# Patient Record
Sex: Female | Born: 1990 | Hispanic: No | Smoking: Never smoker
Health system: Southern US, Community
[De-identification: ages and names within clinical notes are randomized; demographics above are authoritative.]

## PROBLEM LIST (undated history)

## (undated) DIAGNOSIS — G9050 Complex regional pain syndrome I, unspecified: Secondary | ICD-10-CM

## (undated) DIAGNOSIS — S0300XA Dislocation of jaw, unspecified side, initial encounter: Secondary | ICD-10-CM

## (undated) DIAGNOSIS — F319 Bipolar disorder, unspecified: Secondary | ICD-10-CM

## (undated) DIAGNOSIS — O24419 Gestational diabetes mellitus in pregnancy, unspecified control: Secondary | ICD-10-CM

## (undated) DIAGNOSIS — H409 Unspecified glaucoma: Secondary | ICD-10-CM

## (undated) DIAGNOSIS — Z3490 Encounter for supervision of normal pregnancy, unspecified, unspecified trimester: Secondary | ICD-10-CM

## (undated) DIAGNOSIS — N83209 Unspecified ovarian cyst, unspecified side: Secondary | ICD-10-CM

## (undated) HISTORY — DX: Unspecified glaucoma: H40.9

## (undated) HISTORY — DX: Gestational diabetes mellitus in pregnancy, unspecified control: O24.419

## (undated) HISTORY — DX: Bipolar disorder, unspecified: F31.9

## (undated) HISTORY — DX: Dislocation of jaw, unspecified side, initial encounter: S03.00XA

## (undated) HISTORY — DX: Complex regional pain syndrome I, unspecified: G90.50

---
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
## 2014-02-17 DIAGNOSIS — O35DXX0 Maternal care for other (suspected) fetal abnormality and damage, fetal gastrointestinal anomalies, not applicable or unspecified: Secondary | ICD-10-CM

## 2014-07-23 DIAGNOSIS — G43109 Migraine with aura, not intractable, without status migrainosus: Secondary | ICD-10-CM | POA: Insufficient documentation

## 2016-05-04 ENCOUNTER — Encounter (HOSPITAL_COMMUNITY): Payer: Self-pay | Admitting: Emergency Medicine

## 2016-05-04 ENCOUNTER — Emergency Department (HOSPITAL_COMMUNITY)
Admission: EM | Admit: 2016-05-04 | Discharge: 2016-05-05 | Disposition: A | Discharge status: Home / Self Care | Payer: Self-pay | Attending: Emergency Medicine | Admitting: Emergency Medicine

## 2016-05-04 ENCOUNTER — Emergency Department (HOSPITAL_COMMUNITY): Payer: Self-pay

## 2016-05-04 DIAGNOSIS — R102 Pelvic and perineal pain: Secondary | ICD-10-CM | POA: Insufficient documentation

## 2016-05-04 DIAGNOSIS — Z3492 Encounter for supervision of normal pregnancy, unspecified, second trimester: Secondary | ICD-10-CM

## 2016-05-04 DIAGNOSIS — O2342 Unspecified infection of urinary tract in pregnancy, second trimester: Secondary | ICD-10-CM | POA: Insufficient documentation

## 2016-05-04 DIAGNOSIS — Z3A18 18 weeks gestation of pregnancy: Secondary | ICD-10-CM | POA: Insufficient documentation

## 2016-05-04 DIAGNOSIS — N949 Unspecified condition associated with female genital organs and menstrual cycle: Secondary | ICD-10-CM

## 2016-05-04 DIAGNOSIS — N390 Urinary tract infection, site not specified: Secondary | ICD-10-CM

## 2016-05-04 DIAGNOSIS — Z79899 Other long term (current) drug therapy: Secondary | ICD-10-CM | POA: Insufficient documentation

## 2016-05-04 HISTORY — DX: Unspecified ovarian cyst, unspecified side: N83.209

## 2016-05-04 HISTORY — DX: Encounter for supervision of normal pregnancy, unspecified, unspecified trimester: Z34.90

## 2016-05-04 LAB — URINALYSIS, ROUTINE W REFLEX MICROSCOPIC
Bilirubin Urine: NEGATIVE
Glucose, UA: NEGATIVE mg/dL
HGB URINE DIPSTICK: NEGATIVE
KETONES UR: NEGATIVE mg/dL
NITRITE: NEGATIVE
PROTEIN: NEGATIVE mg/dL
Specific Gravity, Urine: 1.015 (ref 1.005–1.030)
pH: 8 (ref 5.0–8.0)

## 2016-05-04 LAB — WET PREP, GENITAL
Clue Cells Wet Prep HPF POC: NONE SEEN
Sperm: NONE SEEN
Trich, Wet Prep: NONE SEEN
Yeast Wet Prep HPF POC: NONE SEEN

## 2016-05-04 LAB — COMPREHENSIVE METABOLIC PANEL
ALT: 10 U/L — ABNORMAL LOW (ref 14–54)
ANION GAP: 6 (ref 5–15)
AST: 14 U/L — ABNORMAL LOW (ref 15–41)
Albumin: 2.3 g/dL — ABNORMAL LOW (ref 3.5–5.0)
Alkaline Phosphatase: 46 U/L (ref 38–126)
BILIRUBIN TOTAL: 0.3 mg/dL (ref 0.3–1.2)
BUN: <5 mg/dL — ABNORMAL LOW (ref 6–20)
CO2: 22 mmol/L (ref 22–32)
Calcium: 8.4 mg/dL — ABNORMAL LOW (ref 8.9–10.3)
Chloride: 107 mmol/L (ref 101–111)
Creatinine, Ser: 0.47 mg/dL (ref 0.44–1.00)
GFR calc Af Amer: >60 mL/min (ref >60)
Glucose, Bld: 100 mg/dL — ABNORMAL HIGH (ref 65–99)
POTASSIUM: 3.6 mmol/L (ref 3.5–5.1)
Sodium: 135 mmol/L (ref 135–145)
TOTAL PROTEIN: 5.6 g/dL — AB (ref 6.5–8.1)

## 2016-05-04 LAB — CBC
HEMATOCRIT: 33.5 % — AB (ref 36.0–46.0)
HEMOGLOBIN: 11 g/dL — AB (ref 12.0–15.0)
MCH: 25.3 pg — ABNORMAL LOW (ref 26.0–34.0)
MCHC: 32.8 g/dL (ref 30.0–36.0)
MCV: 77 fL — ABNORMAL LOW (ref 78.0–100.0)
Platelets: 380 10*3/uL (ref 150–400)
RBC: 4.35 MIL/uL (ref 3.87–5.11)
RDW: 14.8 % (ref 11.5–15.5)
WBC: 10.5 10*3/uL (ref 4.0–10.5)

## 2016-05-04 LAB — LIPASE, BLOOD: Lipase: 13 U/L (ref 11–51)

## 2016-05-04 MED ORDER — ACETAMINOPHEN 325 MG PO TABS
650.0000 mg | ORAL_TABLET | Freq: Once | ORAL | Status: AC
  Administered 2016-05-04: 650 mg via ORAL
  Filled 2016-05-04: qty 2

## 2016-05-04 NOTE — ED Triage Notes (Signed)
C/o pelvic pain and vaginal pressure x 1 hour.  Pt states she is [redacted] weeks pregnant and had normal OB appt yesterday.  Also reports 2 cyst on R ovary.

## 2016-05-04 NOTE — ED Provider Notes (Signed)
[facility] DEPT Provider Note    By signing my name below, I, Andrea [name], attest that this documentation has been prepared under the direction and in the presence of [name], [place]. Electronically Signed: Earmon PhoenixJennifer [name], ED Scribe. 05/04/16. 10:28 PM.    History   Chief Complaint Chief Complaint  Patient presents with  . Pelvic Pain    [redacted] weeks pregnant  . vaginal pressure    The history is provided by the patient and medical records. No language interpreter was used.  Pelvic Pain  This is a new problem. The current episode started 1 to 2 hours ago. The problem occurs constantly. The problem has not changed since onset.Associated symptoms include abdominal pain. Nothing aggravates the symptoms. Nothing relieves the symptoms. She has tried nothing for the symptoms.    HPI Comments:  Andrea [name] is a 25 y.o. female at 4118 weeks gestation, G3P2, who presents to the Emergency Department complaining of lower abdominal cramping pain that began about two hours ago. She rates the pain at 8/10. She states she went to sit down when the pain began. She reports associated pelvic pressure. She has not taken anything for her pain. She denies modifying factors. She denies any vaginal discharge or bleeding, dyspareunia, dysuria, fever, chills, nausea, vomiting. She reports she last had sex last night. [place] lives[place] and [place] in [place]. She reports both her previous children were delivered at 31 and 32 weeks. She denies placenta previa.   Past Medical History:  Diagnosis Date  . Ovarian cyst   . Pregnant     There are no active problems to display for this patient.   History reviewed. No pertinent surgical history.  OB History    Gravida Para Term Preterm AB Living   1             SAB TAB Ectopic Multiple Live Births                   Home Medications    Prior to Admission medications   Medication Sig  Start Date End Date Taking? Authorizing Provider  cyclobenzaprine (FLEXERIL) 5 MG tablet Take 1 tablet (5 mg total) by mouth 2 (two) times daily as needed for muscle spasms. 05/05/16   Hope Orlene OchM Neese, NP  nitrofurantoin, macrocrystal-monohydrate, (MACROBID) 100 MG capsule Take 1 capsule (100 mg total) by mouth 2 (two) times daily. 05/05/16   Hope Orlene OchM Neese, NP    Family History No family history on file.  Social History Social History  Substance Use Topics  . Smoking status: Never Smoker  . Smokeless tobacco: Never Used  . Alcohol use No     Allergies   Ultrasound gel   Review of Systems Review of Systems  Constitutional: Negative for chills and fever.  Gastrointestinal: Positive for abdominal pain. Negative for nausea and vomiting.  Genitourinary: Positive for pelvic pain. Negative for difficulty urinating, dyspareunia, dysuria, frequency, urgency, vaginal bleeding and vaginal discharge.  All other systems reviewed and are negative.    Physical Exam Updated Vital Signs BP 119/76 (BP Location: Left Arm)   Pulse 88   Temp 97.7 F (36.5 C) (Oral)   Resp 16   SpO2 95%   Physical Exam  Constitutional: She is oriented to person, place, and time. She appears well-developed and well-nourished.  Eyes: EOM are normal.  Neck: Neck supple.  Cardiovascular: Normal rate, regular rhythm and normal heart sounds.  Exam  reveals no gallop and no friction rub.   No murmur heard. Pulmonary/Chest: Effort normal and breath sounds normal. No respiratory distress. She has no wheezes. She has no rales.  Abdominal: Soft. Bowel sounds are normal. There is tenderness. There is no rebound and no guarding.  Generalized tenderness with increased tenderness in lower abdomen. Fetal heart tones 160s.  Genitourinary: Pelvic exam was performed with patient supine. There is no lesion on the right labia. There is no lesion on the left labia.  Genitourinary Comments: External genitalia without lesions. White  discharge in vaginal vault. Cervix posterior. Internal os closed. Uterus gravid consistent with dates.  Musculoskeletal: Normal range of motion.  Neurological: She is alert and oriented to person, place, and time. No cranial nerve deficit.  Skin: Skin is warm and dry.  Nursing note and vitals reviewed.    ED Treatments / Results  DIAGNOSTIC STUDIES: Oxygen Saturation is 95% on RA, adequate by my interpretation.   COORDINATION OF CARE: 10:17 PM- Will check fetal heart tones and perform pelvic exam. Pt verbalizes understanding and agrees to plan.  10:28 PM- Will order ultrasound.  Medications  acetaminophen (TYLENOL) tablet 650 mg (650 mg Oral Given 05/04/16 2315)  cyclobenzaprine (FLEXERIL) tablet 5 mg (5 mg Oral Given 05/05/16 0048)  nitrofurantoin (macrocrystal-monohydrate) (MACROBID) capsule 100 mg (100 mg Oral Given 05/05/16 0052)    Labs (all labs ordered are listed, but only abnormal results are displayed) Labs Reviewed  WET PREP, GENITAL - Abnormal; Notable for the following:       Result Value   WBC, Wet Prep HPF POC MANY (*)    All other components within normal limits  COMPREHENSIVE METABOLIC PANEL - Abnormal; Notable for the following:    Glucose, Bld 100 (*)    BUN <5 (*)    Calcium 8.4 (*)    Total Protein 5.6 (*)    Albumin 2.3 (*)    AST 14 (*)    ALT 10 (*)    All other components within normal limits  CBC - Abnormal; Notable for the following:    Hemoglobin 11.0 (*)    HCT 33.5 (*)    MCV 77.0 (*)    MCH 25.3 (*)    All other components within normal limits  URINALYSIS, ROUTINE W REFLEX MICROSCOPIC - Abnormal; Notable for the following:    APPearance CLOUDY (*)    Leukocytes, UA TRACE (*)    Bacteria, UA RARE (*)    Squamous Epithelial / LPF 0-5 (*)    All other components within normal limits  URINE CULTURE  LIPASE, BLOOD  GC/CHLAMYDIA PROBE AMP ([place]) NOT AT [facility]     Radiology [place] Ob Limited  Result Date: 05/05/2016 CLINICAL DATA:   25 year old pregnant female with abdominal pain EXAM: LIMITED OBSTETRIC ULTRASOUND FINDINGS: Number of Fetuses: 1 Heart Rate:  157 bpm Movement: Detected Presentation: Cephalic Placental Location: Anterior Previa: None Amniotic Fluid (Subjective):  Within normal limits. BPD:  4.3cm 18w  6d MATERNAL FINDINGS: Cervix:  Appears closed. Uterus/Adnexae:  No abnormality visualized. IMPRESSION: Single live intrauterine pregnancy with an estimated gestational age of [redacted] weeks, 6 days based on today's measurements. No acute findings identified. This exam is performed on an emergent basis and does not comprehensively evaluate fetal size, dating, or anatomy; follow-up complete OB US should be considered if further fetal assessment is warranted. Electronically Signed   By: Elgie CollardArash  Radparvar M.D.   On: 05/05/2016 00:32    Procedures Procedures (including critical care time) Consult  with Dr. Emelda Fear, on call OB, will treat for UTI with Macrobid and muscle relaxant for possible round ligament pain. Urine sent for culture. Patient to f/u with her OB or go to [facility] for worsening symptoms. Pelvic rest. Discussed with the patient and all questioned fully answered. Patient appears stable for d/c with normal OB ultrasound.   Medications Ordered in ED Medications  acetaminophen (TYLENOL) tablet 650 mg (650 mg Oral Given 05/04/16 2315)  cyclobenzaprine (FLEXERIL) tablet 5 mg (5 mg Oral Given 05/05/16 0048)  nitrofurantoin (macrocrystal-monohydrate) (MACROBID) capsule 100 mg (100 mg Oral Given 05/05/16 0052)     Initial Impression / Assessment and Plan / ED Course  I have reviewed the triage vital signs and the nursing notes.  Pertinent labs & imaging results that were available during my care of the patient were reviewed by me and considered in my medical decision making (see chart for details).  Clinical Course     I personally performed the services described in this documentation, which was scribed in my  presence. The recorded information has been reviewed and is accurate.  Final Clinical Impressions(s) / ED Diagnoses   Final diagnoses:  Lower urinary tract infectious disease  Round ligament pain  Second trimester pregnancy    New Prescriptions New Prescriptions   CYCLOBENZAPRINE (FLEXERIL) 5 MG TABLET    Take 1 tablet (5 mg total) by mouth 2 (two) times daily as needed for muscle spasms.   NITROFURANTOIN, MACROCRYSTAL-MONOHYDRATE, (MACROBID) 100 MG CAPSULE    Take 1 capsule (100 mg total) by mouth 2 (two) times daily.     [address] [place], NP 05/05/16 0105    Gilda Crease, MD 05/05/16 [phone]

## 2016-05-05 MED ORDER — CYCLOBENZAPRINE HCL 10 MG PO TABS
5.0000 mg | ORAL_TABLET | Freq: Once | ORAL | Status: AC
  Administered 2016-05-05: 5 mg via ORAL
  Filled 2016-05-05: qty 1

## 2016-05-05 MED ORDER — NITROFURANTOIN MONOHYD MACRO 100 MG PO CAPS: 100.0000 mg | ORAL_CAPSULE | Freq: Two times a day (BID) | ORAL | 0 refills | Status: DC

## 2016-05-05 MED ORDER — NITROFURANTOIN MONOHYD MACRO 100 MG PO CAPS
100.0000 mg | ORAL_CAPSULE | Freq: Once | ORAL | Status: AC
  Administered 2016-05-05: 100 mg via ORAL
  Filled 2016-05-05: qty 1

## 2016-05-05 MED ORDER — CYCLOBENZAPRINE HCL 5 MG PO TABS: 5.0000 mg | ORAL_TABLET | Freq: Two times a day (BID) | ORAL | 0 refills | Status: DC | PRN

## 2016-05-05 NOTE — ED Notes (Addendum)
Pt returned from Ultrasound.

## 2016-05-05 NOTE — Discharge Instructions (Signed)
Your urine shows that you have an early infection. We are starting antibiotics and have sent the urine for culture. If we need to change your antibiotic someone will call you.

## 2016-05-06 LAB — URINE CULTURE: Culture: <10000 — AB

## 2016-05-07 LAB — GC/CHLAMYDIA PROBE AMP (~~LOC~~) NOT AT ARMC
Chlamydia: NEGATIVE
Neisseria Gonorrhea: NEGATIVE

## 2016-08-05 DIAGNOSIS — O4590 Premature separation of placenta, unspecified, unspecified trimester: Secondary | ICD-10-CM

## 2016-09-24 ENCOUNTER — Emergency Department (HOSPITAL_COMMUNITY)
Admission: EM | Admit: 2016-09-24 | Discharge: 2016-09-24 | Disposition: A | Discharge status: Home / Self Care | Payer: Medicaid - Out of State | Attending: Emergency Medicine | Admitting: Emergency Medicine

## 2016-09-24 ENCOUNTER — Encounter (HOSPITAL_COMMUNITY): Payer: Self-pay

## 2016-09-24 ENCOUNTER — Emergency Department (HOSPITAL_COMMUNITY): Payer: Medicaid - Out of State

## 2016-09-24 DIAGNOSIS — G44319 Acute post-traumatic headache, not intractable: Secondary | ICD-10-CM | POA: Insufficient documentation

## 2016-09-24 DIAGNOSIS — Y92410 Unspecified street and highway as the place of occurrence of the external cause: Secondary | ICD-10-CM | POA: Diagnosis not present

## 2016-09-24 DIAGNOSIS — Y999 Unspecified external cause status: Secondary | ICD-10-CM | POA: Insufficient documentation

## 2016-09-24 DIAGNOSIS — Y939 Activity, unspecified: Secondary | ICD-10-CM | POA: Diagnosis not present

## 2016-09-24 DIAGNOSIS — R51 Headache: Secondary | ICD-10-CM | POA: Diagnosis present

## 2016-09-24 NOTE — ED Notes (Signed)
See provider note. 
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
## 2017-02-13 IMAGING — US US OB LIMITED
1 series · 14 of 28 positions shown · non-contrast
Comparison: none

CLINICAL DATA: 25-year-old pregnant female with abdominal pain

EXAM:
LIMITED OBSTETRIC ULTRASOUND

[Series 1: us ob limited · 0.23mm/px · 32 acquisitions, 14 frames shown]
[im 2/32]
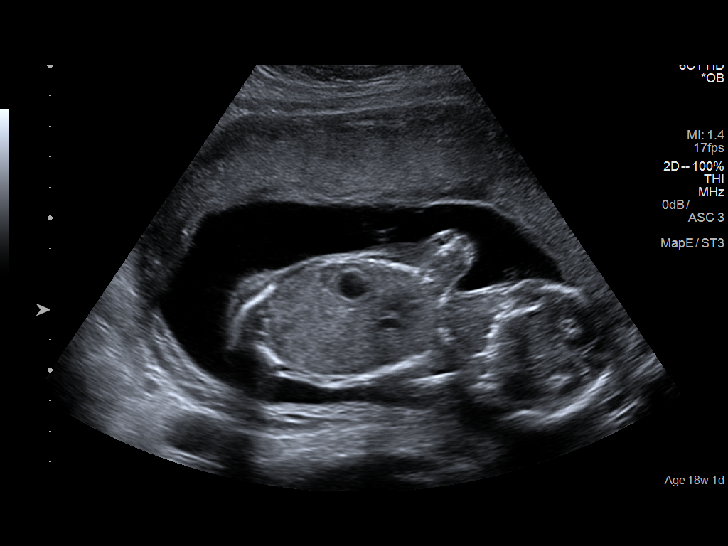
[im 4/32]
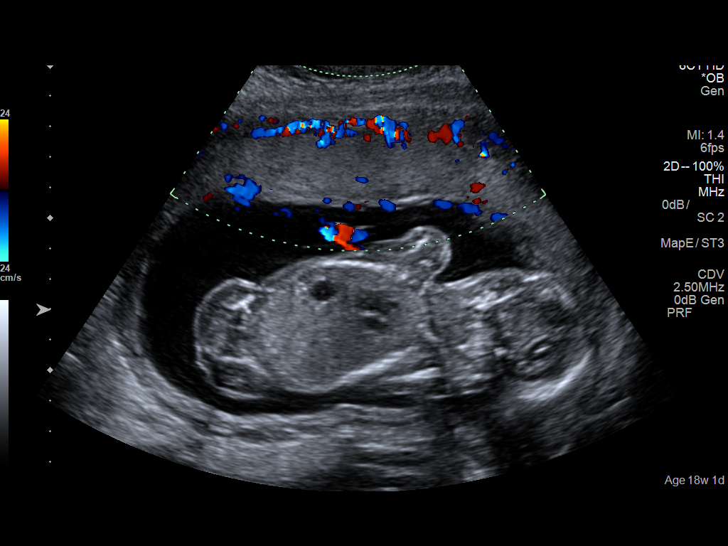
[im 6/32]
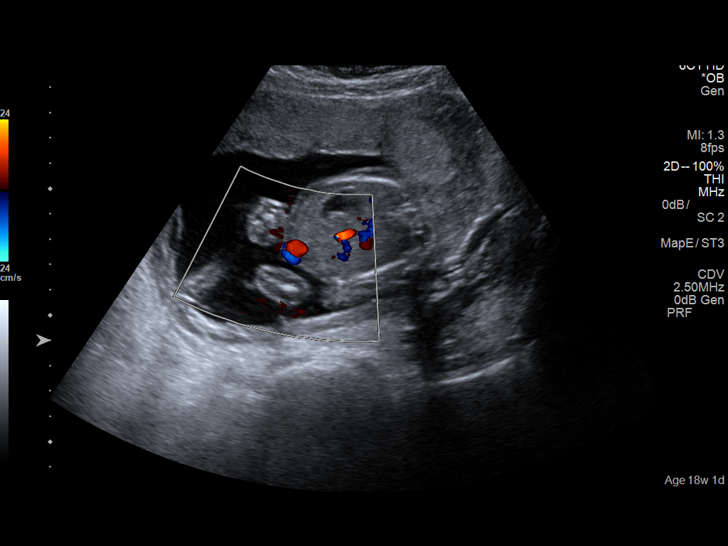
[im 9/32]
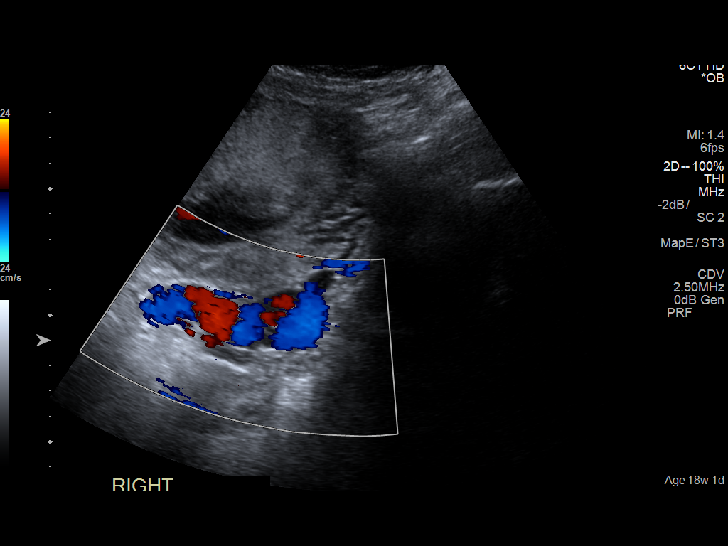
[im 11/32]
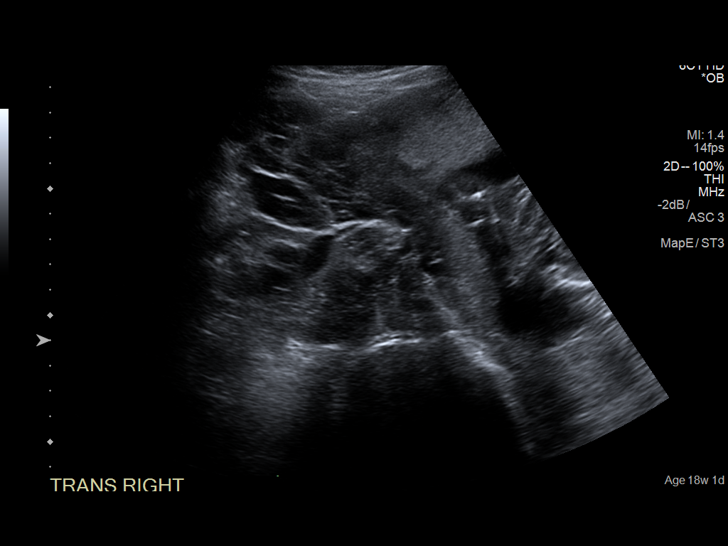
[im 13/32]
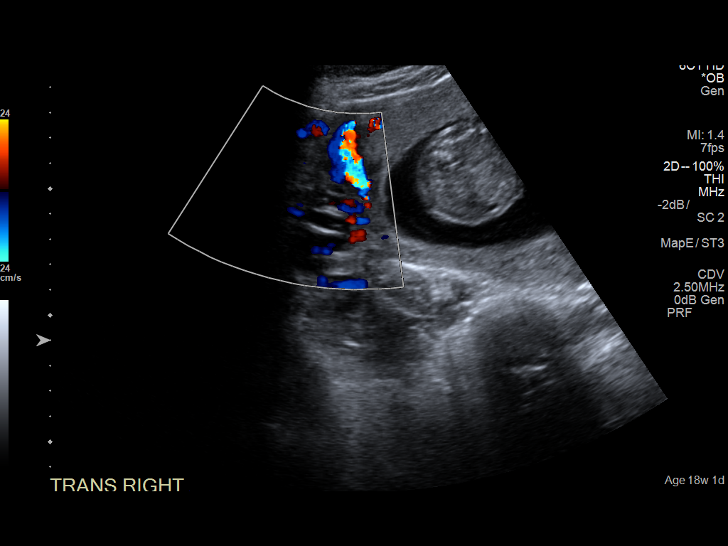
[im 15/32]
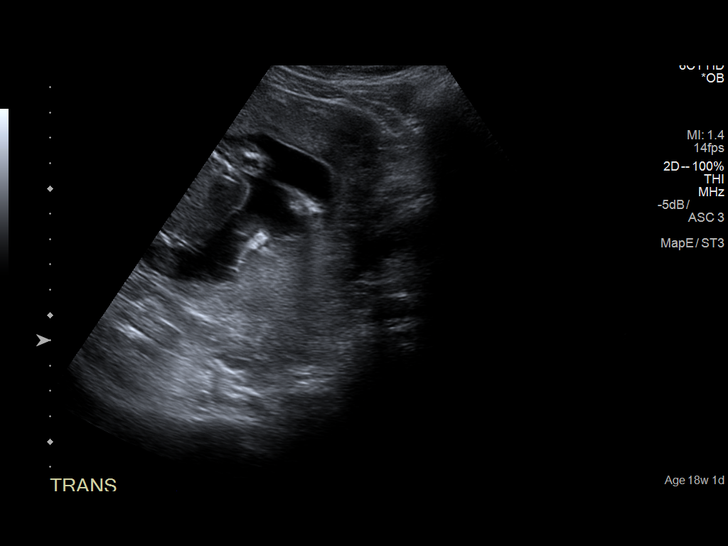
[im 18/32]
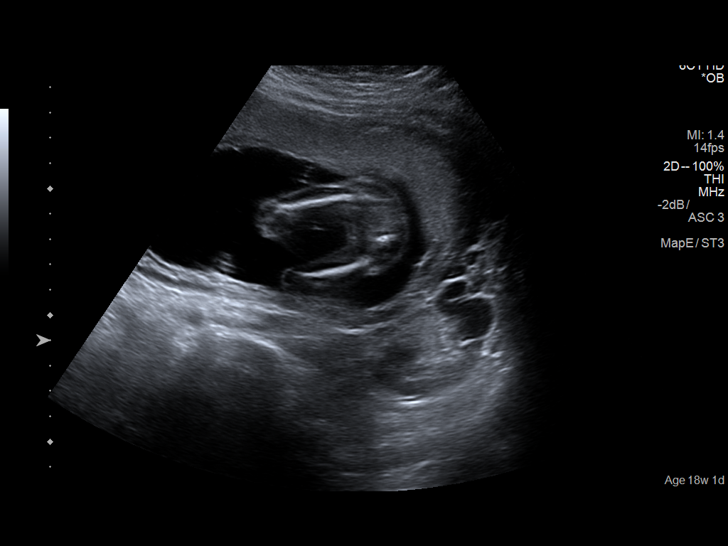
[im 20/32]
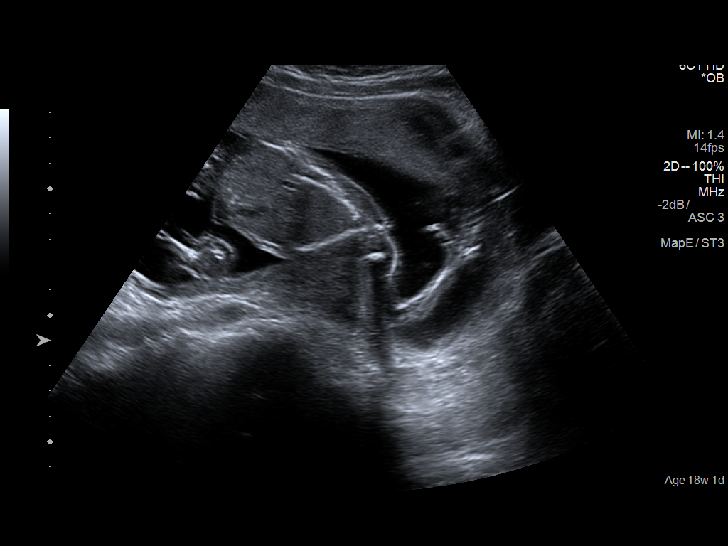
[im 22/32]
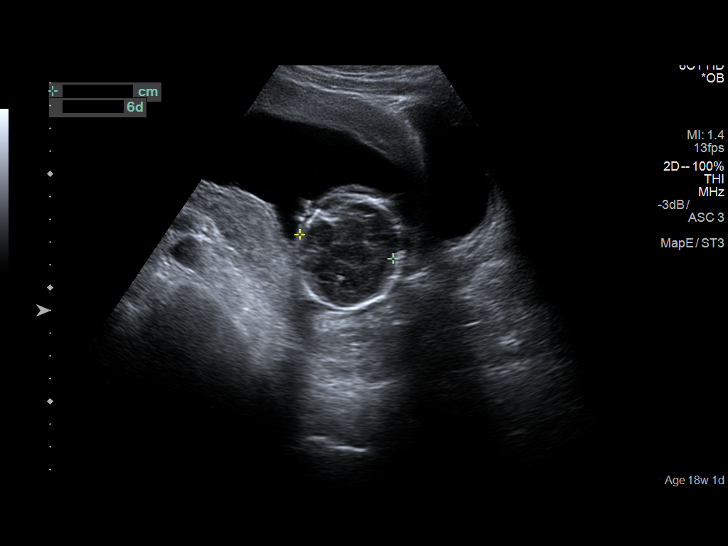
[im 25/32]
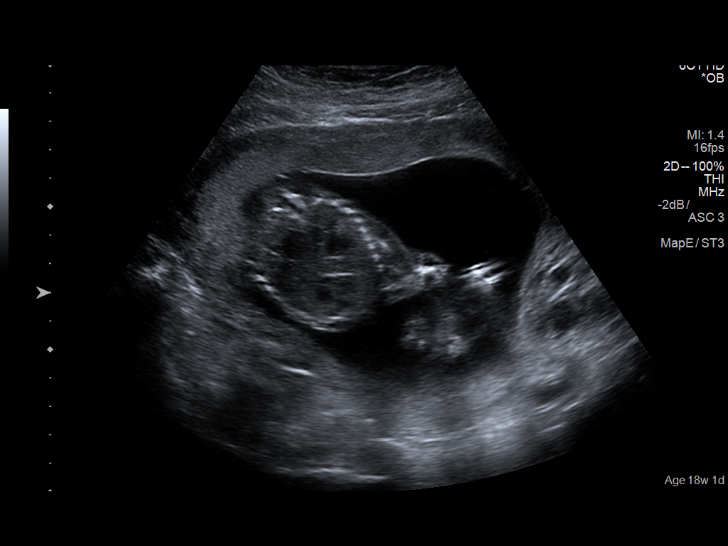
[im 27/32]
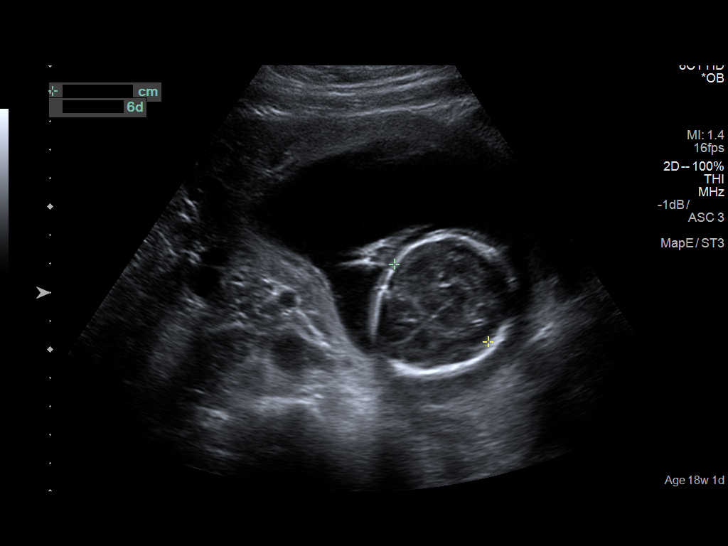
[im 29/32]
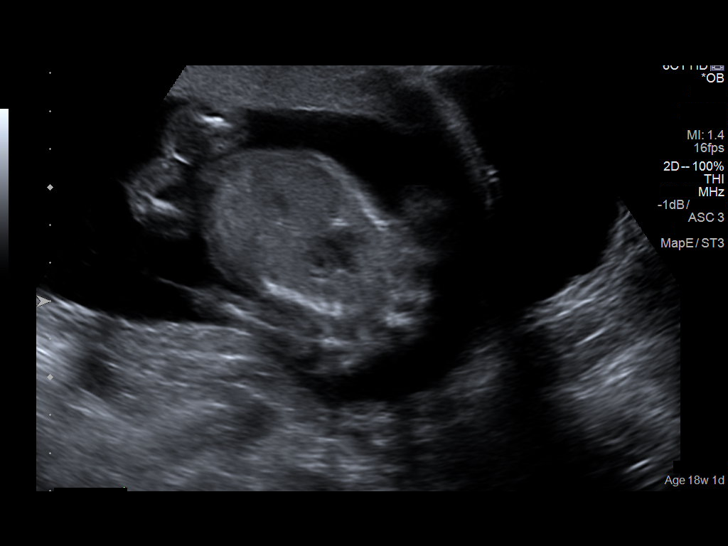
[im 32/32]
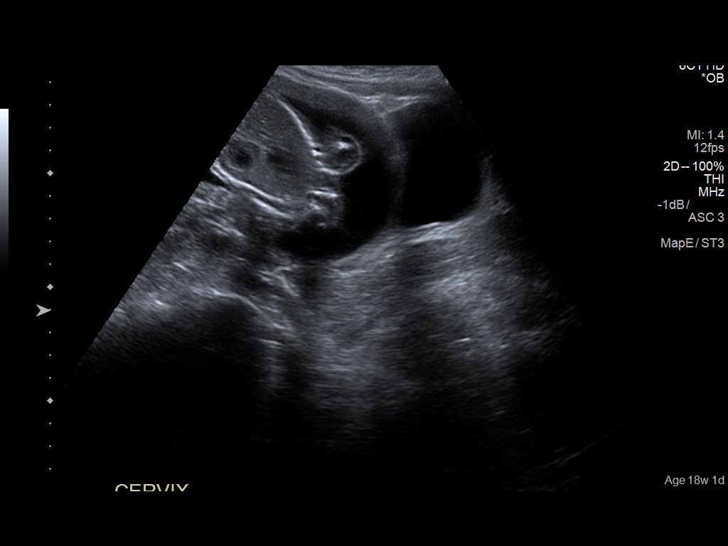

[14 of 28 positions shown; findings below may reference images not displayed]

FINDINGS: Number of Fetuses: 1

Heart Rate:  157 bpm

Movement: Detected

Presentation: Cephalic

Placental Location: Anterior

Previa: None

Amniotic Fluid (Subjective):  Within normal limits.

BPD:  4.3cm 18w  6d

MATERNAL FINDINGS:

Cervix:  Appears closed.

Uterus/Adnexae:  No abnormality visualized.
IMPRESSION: Single live intrauterine pregnancy with an estimated gestational age
of 18 weeks, 6 days based on today's measurements. No acute findings
identified.

This exam is performed on an emergent basis and does not
comprehensively evaluate fetal size, dating, or anatomy; follow-up
complete OB US should be considered if further fetal assessment is
warranted.

## 2017-02-21 ENCOUNTER — Ambulatory Visit: Payer: Medicaid - Out of State | Admitting: Internal Medicine

## 2017-02-25 ENCOUNTER — Emergency Department (HOSPITAL_COMMUNITY): Payer: Medicaid Other

## 2017-02-25 ENCOUNTER — Emergency Department (HOSPITAL_COMMUNITY)
Admission: EM | Admit: 2017-02-25 | Discharge: 2017-02-26 | Disposition: A | Discharge status: Home / Self Care | Payer: Medicaid Other | Attending: Emergency Medicine | Admitting: Emergency Medicine

## 2017-02-25 ENCOUNTER — Encounter (HOSPITAL_COMMUNITY): Payer: Self-pay

## 2017-02-25 DIAGNOSIS — R079 Chest pain, unspecified: Secondary | ICD-10-CM | POA: Insufficient documentation

## 2017-02-25 DIAGNOSIS — Z5321 Procedure and treatment not carried out due to patient leaving prior to being seen by health care provider: Secondary | ICD-10-CM | POA: Diagnosis not present

## 2017-02-25 LAB — BASIC METABOLIC PANEL
ANION GAP: 9 (ref 5–15)
BUN: 10 mg/dL (ref 6–20)
CHLORIDE: 104 mmol/L (ref 101–111)
CO2: 24 mmol/L (ref 22–32)
Calcium: 8.6 mg/dL — ABNORMAL LOW (ref 8.9–10.3)
Creatinine, Ser: 0.71 mg/dL (ref 0.44–1.00)
GFR calc non Af Amer: >60 mL/min (ref >60)
Glucose, Bld: 107 mg/dL — ABNORMAL HIGH (ref 65–99)
POTASSIUM: 3.6 mmol/L (ref 3.5–5.1)
SODIUM: 137 mmol/L (ref 135–145)

## 2017-02-25 LAB — CBC
HEMATOCRIT: 41.5 % (ref 36.0–46.0)
HEMOGLOBIN: 13.1 g/dL (ref 12.0–15.0)
MCH: 24.5 pg — ABNORMAL LOW (ref 26.0–34.0)
MCHC: 31.6 g/dL (ref 30.0–36.0)
MCV: 77.6 fL — ABNORMAL LOW (ref 78.0–100.0)
PLATELETS: 385 10*3/uL (ref 150–400)
RBC: 5.35 MIL/uL — AB (ref 3.87–5.11)
RDW: 15.2 % (ref 11.5–15.5)
WBC: 9.6 10*3/uL (ref 4.0–10.5)

## 2017-02-25 LAB — I-STAT TROPONIN, ED: Troponin i, poc: 0 ng/mL (ref 0.00–0.08)

## 2017-02-25 NOTE — ED Triage Notes (Signed)
Pt reports central chest pain radiating to her right arm X2 days. She reports she has also had a strong cough X6 months. She reports associated vomiting. Skin warm and dry, no distress noted.

## 2017-02-26 NOTE — ED Notes (Signed)
Patient called for vitals and to be roomed and no answer x2

## 2017-03-07 ENCOUNTER — Encounter: Payer: Self-pay | Admitting: Licensed Clinical Social Worker

## 2017-03-07 ENCOUNTER — Encounter: Payer: Self-pay | Admitting: Internal Medicine

## 2017-03-07 ENCOUNTER — Ambulatory Visit (INDEPENDENT_AMBULATORY_CARE_PROVIDER_SITE_OTHER): Payer: Medicaid Other | Admitting: Internal Medicine

## 2017-03-07 DIAGNOSIS — G9050 Complex regional pain syndrome I, unspecified: Secondary | ICD-10-CM | POA: Diagnosis not present

## 2017-03-07 DIAGNOSIS — F319 Bipolar disorder, unspecified: Secondary | ICD-10-CM | POA: Diagnosis not present

## 2017-03-07 DIAGNOSIS — Z0000 Encounter for general adult medical examination without abnormal findings: Secondary | ICD-10-CM

## 2017-03-07 DIAGNOSIS — E7849 Other hyperlipidemia: Secondary | ICD-10-CM | POA: Diagnosis present

## 2017-03-07 DIAGNOSIS — F329 Major depressive disorder, single episode, unspecified: Secondary | ICD-10-CM | POA: Diagnosis not present

## 2017-03-07 DIAGNOSIS — F32A Depression, unspecified: Secondary | ICD-10-CM

## 2017-03-07 HISTORY — DX: Other hyperlipidemia: E78.49

## 2017-03-07 MED ORDER — QUETIAPINE FUMARATE 50 MG PO TABS: ORAL_TABLET | ORAL | 0 refills | Status: DC

## 2017-03-07 NOTE — Patient Instructions (Addendum)
I want you to start Seroquel and follow up with me in 2 weeks. I want you to take 1 pill for 3 days, then 2 pills for 3 days then 3 pills thereafter. I am going to check your blood today for your cholesterol and     National Suicidal Hotline  We can all help prevent suicide. The Lifeline provides 24/7, free and confidential support for people in distress, prevention and crisis resources for you or your loved ones, and best practices for professionals.  [phone]

## 2017-03-07 NOTE — Progress Notes (Signed)
   [name] Kerrin Mo, MD Phone: [phone]  Reason For Visit: Establish Care, new patient   #Depression: Patient states that she goes from really happy to really sad Symptoms: Depression and instability of mood  Age of onset of first mood disturbance:Age 26 years old  Duration of CURRENT symptoms:Has had significant depression lately, thinks about SI but has no plans. States she would never hurt herself because she has to be there for her daughters  Psychiatric History - Diagnoses:Biopolar Disorder  - Hospitalizations:  2011 and 2009 for SI ideation;  - Pharmacotherapy: Cymbalta (made her have SI) and Zoloft(did not do anything) - Outpatient therapy:  Family history of psychiatric issues:Uncle, Great Uncle - committed SI  Current and history of substance use: EtOH use, drinks about half a bottle of hard liquer 3-4 times a week Medical conditions that might explain or contribute to symptoms:  PHQ-9:18 GAD7:7 MDQ (if indicated):  Positive   Past Medical History Reviewed problem list.  Medications- reviewed and updated No additions to family history Social history- patient is a non- smoker  Objective: BP 90/60 (BP Location: Left Arm, Patient Position: Sitting, Cuff Size: Normal)   Pulse 78   Temp 98.4 F (36.9 C) (Oral)   Resp 18   Ht _0  (1.575 m)   Wt 160 lb 12.8 oz (72.9 kg)   SpO2 97%   BMI 29.41 kg/m  Gen: NAD, alert, cooperative with exam HEENT: Normal    Neck: No masses palpated. No lymphadenopathy    Ears: Tympanic membranes intact,    Eyes: PERRLA    Nose: nasal turbinates moist    Throat: moist mucus membranes, no erythema Cardio: regular rate and rhythm, S1S2 heard, no murmurs appreciated Pulm: clear to auscultation bilaterally, no wheezes, rhonchi or rales GI: soft, non-tender, non-distended, bowel sounds present, no hepatomegaly, no splenomegaly Extremities: warm, well perfused, No edema, cyanosis or clubbing;  MSK:  Normal gait and station Skin: dry, intact, no rashes or lesions Neuro: Strength and sensation grossly intact   Assessment/Plan: See problem based a/p  Depression PHQ-9 18, severe depression/biopolar symptoms. Discussed following with psychiatry would be likely best however patient would like to stay in house currently and consider follow up in the future - CMP14+EGFR - QUEtiapine (SEROQUEL) 50 MG tablet; Please start at 50 mg for 3 days, increase to 100 mg for 3 days, then to 150 mg  Dispense: 90 tablet;   Hyperlipidemia, familial, high LDL Obtain lipid panel

## 2017-03-07 NOTE — Progress Notes (Signed)
Patient is new patient

## 2017-03-07 NOTE — Progress Notes (Signed)
ESTIMATE TIME:20 minutes Type of Service: Integrated Behavioral Health warm handoff  Interpreter:No.   SUBJECTIVE: Andrea [name] is a 26 y.o. female referred by Dr. Cathlean CowerMikell for: symptoms of  anxiety and depression.  Patient is pleasant, soft spoke, and tearful at times. Patient reports : difficulty with cooking and cleaning.  Feeling tired and wants to sleep but has not been able to sleep.  Duration of problem: about 2 years and progressed Current / Hx of substance use: patient reports drinking 5 or 6 oz of liquor about 3 times a week to help her sleep.  Started drinking about 2 years ago.  Reports no blackouts or situations where she was unable to remember what happened.  AUDIT completed with a score of 6.  Harm reduction reinforced and discussed goals. Patient also report thoughts of self-harm with no plan.   Suicide Assessment Protective factors (what has kept the patient from self-harm thus far):  Patient reports her children and fiance keep her from acting on thouths Substance use / abuse:  yes Presence of hallucinations / delusions: no History of SI / Attempts: yes 2011 Family history of attempted or completed suicide: yes Duration and Intensity of SI:  Off and on for years History of prior psychiatric hospitalizations: 2009 and 2011 Coping mechanisms: spending time with children and family How likely are you to act on these thoughts of hurting yourself or ending your life sometime over the next month? Not likely at all  LIFE CONTEXT:  Family & Social:patient lives with fiance, her three children and 2 niece (youngest 3 and 7 months). Family lives in [place]. 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    GOALS: Patient will reduce symptoms of: anxiety, depression and mood instability, and increase ability ZO:[id number]:coping skills, healthy habits and self-management skills, Decrease self-medicating behaviors.  INTERVENTION:  Supportive  Counseling, Reflective listening, Behavioral Therapy (Relaxed breathing),  Screening Tool(s)  Administered by PCP see Dr. Aris GeorgiaMikell's note for scores of GAD, PHQ-9 and MDQ.  LCSW provided patient with 24/7 as part of safety plan and reassessed patient's ability to contact for safety.   ISSUES DISCUSSED: Review of Integrated care services, support system, previous and current coping skills,  Review of safety plan.    ASSESSMENT:Patient currently experiencing symptoms of depression and anxiety.  Symptoms exacerbated by self medication of alcohol and being off of mental health medications.  Patient may benefit from, and is in agreement to receive further assessment and therapeutic interventions to assist with managing her symptoms. Patient is declining outside therapy and would like to therapy with patient in-house on brief intervention and managing her symptoms.  PLAN:   1.Patient will F/U with LCSW in two weeks   2. Patient declined F/U phone call   3. Behavioral recommendations: relaxed breathing   4. Referral:Integrated Behavioral Health Services (In Clinic),   Warm Hand Off Completed.     Sammuel Hineseborah Jeree Delcid, LCSW Licensed Clinical Social Worker [facility]   [phone] 2:23 PM

## 2017-03-08 LAB — LIPID PANEL
CHOLESTEROL TOTAL: 186 mg/dL (ref 100–199)
Chol/HDL Ratio: 4.7 ratio — ABNORMAL HIGH (ref 0.0–4.4)
HDL: 40 mg/dL (ref >39)
LDL Calculated: 126 mg/dL — ABNORMAL HIGH (ref 0–99)
Triglycerides: 98 mg/dL (ref 0–149)
VLDL CHOLESTEROL CAL: 20 mg/dL (ref 5–40)

## 2017-03-08 LAB — CMP14+EGFR
A/G RATIO: 1.3 (ref 1.2–2.2)
ALBUMIN: 3.5 g/dL (ref 3.5–5.5)
ALK PHOS: 65 IU/L (ref 39–117)
ALT: 12 IU/L (ref 0–32)
AST: 13 IU/L (ref 0–40)
BILIRUBIN TOTAL: 0.3 mg/dL (ref 0.0–1.2)
BUN / CREAT RATIO: 13 (ref 9–23)
BUN: 9 mg/dL (ref 6–20)
CHLORIDE: 103 mmol/L (ref 96–106)
CO2: 26 mmol/L (ref 20–29)
Calcium: 8.7 mg/dL (ref 8.7–10.2)
Creatinine, Ser: 0.69 mg/dL (ref 0.57–1.00)
GFR calc Af Amer: 140 mL/min/{1.73_m2} (ref >59)
GFR calc non Af Amer: 121 mL/min/{1.73_m2} (ref >59)
Globulin, Total: 2.7 g/dL (ref 1.5–4.5)
Glucose: 100 mg/dL — ABNORMAL HIGH (ref 65–99)
POTASSIUM: 4.4 mmol/L (ref 3.5–5.2)
Sodium: 140 mmol/L (ref 134–144)
Total Protein: 6.2 g/dL (ref 6.0–8.5)

## 2017-03-08 LAB — CBC
HEMATOCRIT: 40.6 % (ref 34.0–46.6)
Hemoglobin: 13 g/dL (ref 11.1–15.9)
MCH: 24.7 pg — AB (ref 26.6–33.0)
MCHC: 32 g/dL (ref 31.5–35.7)
MCV: 77 fL — AB (ref 79–97)
PLATELETS: 388 10*3/uL — AB (ref 150–379)
RBC: 5.26 x10E6/uL (ref 3.77–5.28)
RDW: 16 % — ABNORMAL HIGH (ref 12.3–15.4)
WBC: 9 10*3/uL (ref 3.4–10.8)

## 2017-03-08 LAB — HIV ANTIBODY (ROUTINE TESTING W REFLEX): HIV SCREEN 4TH GENERATION: NONREACTIVE

## 2017-03-11 NOTE — Assessment & Plan Note (Signed)
Obtain lipid panel

## 2017-03-11 NOTE — Assessment & Plan Note (Signed)
PHQ-9 18, severe depression/biopolar symptoms. Discussed following with psychiatry would be likely best however patient would like to stay in house currently and consider follow up in the future - CMP14+EGFR - QUEtiapine (SEROQUEL) 50 MG tablet; Please start at 50 mg for 3 days, increase to 100 mg for 3 days, then to 150 mg  Dispense: 90 tablet;

## 2017-03-12 ENCOUNTER — Telehealth: Payer: Self-pay | Admitting: *Deleted

## 2017-03-12 NOTE — Telephone Encounter (Signed)
Received fax from pharmacy.  Seroquel is on formulary, called [place], they need to document a mental health welness, not PA.  Questions asked:  1. Provider NPI and Name 2. Dx for med (gave depression and bipolar 1 as listed on script) 3. Symptoms (gave instability of mood)  Was approved and pharmacy notified.   Auth# [id number];; Pasqual Farias, Maryjo RochesterJessica Dawn, CMA

## 2017-03-26 ENCOUNTER — Encounter: Payer: Self-pay | Admitting: Internal Medicine

## 2017-03-27 ENCOUNTER — Ambulatory Visit: Payer: Medicaid Other | Admitting: Internal Medicine

## 2017-03-27 ENCOUNTER — Ambulatory Visit: Payer: Medicaid Other

## 2018-01-01 ENCOUNTER — Ambulatory Visit (HOSPITAL_COMMUNITY)
Admission: EM | Admit: 2018-01-01 | Discharge: 2018-01-01 | Disposition: A | Discharge status: Home / Self Care | Payer: Medicaid Other | Attending: Family Medicine | Admitting: Family Medicine

## 2018-01-01 ENCOUNTER — Encounter (HOSPITAL_COMMUNITY): Payer: Self-pay

## 2018-01-01 DIAGNOSIS — G43901 Migraine, unspecified, not intractable, with status migrainosus: Secondary | ICD-10-CM

## 2018-01-01 MED ORDER — KETOROLAC TROMETHAMINE 60 MG/2ML IM SOLN
INTRAMUSCULAR | Status: AC
  Filled 2018-01-01: qty 2

## 2018-01-01 MED ORDER — DEXAMETHASONE SODIUM PHOSPHATE 10 MG/ML IJ SOLN
INTRAMUSCULAR | Status: AC
  Filled 2018-01-01: qty 1

## 2018-01-01 MED ORDER — DEXAMETHASONE SODIUM PHOSPHATE 10 MG/ML IJ SOLN
10.0000 mg | Freq: Once | INTRAMUSCULAR | Status: AC
  Administered 2018-01-01: 10 mg via INTRAMUSCULAR

## 2018-01-01 MED ORDER — KETOROLAC TROMETHAMINE 60 MG/2ML IM SOLN
60.0000 mg | Freq: Once | INTRAMUSCULAR | Status: AC
  Administered 2018-01-01: 60 mg via INTRAMUSCULAR

## 2018-01-01 MED ORDER — TRAMADOL HCL 50 MG PO TABS: 50.0000 mg | ORAL_TABLET | Freq: Four times a day (QID) | ORAL | 0 refills | Status: DC | PRN

## 2018-01-01 NOTE — ED Triage Notes (Signed)
Headache 4 days

## 2018-01-01 NOTE — ED Provider Notes (Signed)
[place]   [id number] 01/01/18 Arrival Time: 1287   SUBJECTIVE:  Andrea [name] is a 27 y.o. female who presents to the urgent care with complaint of persistent headache for 4 days following blood donation on Saturday.  Patient has a history of headaches as a child but has not had a bad headache in 8 years.  This headache is interfered with sleep as well as normal activities.  She has been somewhat nauseated at first but now the problem is bilateral temporal pain only.  Patient says that she has had bilateral visual blurriness at times.  She has had no trouble moving extremities, no neck stiffness, and no fever.  She has had some mild neck aching however.     Past Medical History:  Diagnosis Date  . Ovarian cyst   . Pregnant    Family History  Problem Relation Age of Onset  . Breast cancer Mother 62  . Hyperlipidemia Father   . Alcoholism Father   . Diabetes Sister   . Post-traumatic stress disorder Sister   . Colon cancer Maternal Grandmother   . Diabetes Mellitus II Paternal Grandmother    Social History   Socioeconomic History  . Marital status: Significant Other    Spouse name: Not on file  . Number of children: Not on file  . Years of education: Not on file  . Highest education level: Not on file  Occupational History  . Occupation: [employer]   Social Needs  . Financial resource strain: Not on file  . Food insecurity:    Worry: Not on file    Inability: Not on file  . Transportation needs:    Medical: Not on file    Non-medical: Not on file  Tobacco Use  . Smoking status: Never Smoker  . Smokeless tobacco: Never Used  Substance and Sexual Activity  . Alcohol use: No  . Drug use: No  . Sexual activity: Yes    Birth control/protection: Implant    Comment: nexplanon  Lifestyle  . Physical activity:    Days per week: Not on file    Minutes per session: Not on file  . Stress: Not on file  Relationships  . Social connections:    Talks on  phone: Not on file    Gets together: Not on file    Attends religious service: Not on file    Active member of club or organization: Not on file    Attends meetings of clubs or organizations: Not on file    Relationship status: Not on file  . Intimate partner violence:    Fear of current or ex partner: Not on file    Emotionally abused: Not on file    Physically abused: Not on file    Forced sexual activity: Not on file  Other Topics Concern  . Not on file  Social History Narrative   Lives with kids and Jeneen Rinks. Has been with Jeneen Rinks for 15 years, since 6th grade.    No outpatient medications have been marked as taking for the 01/01/18 encounter [facility] Encounter).   Allergies  Allergen Reactions  . Cymbalta [Duloxetine Hcl]     "causes me to feel more suicidal"  . Ultrasound Gel       ROS: As per HPI, remainder of ROS negative.   OBJECTIVE:   Vitals:   01/01/18 1822 01/01/18 1823  BP:  98/64  Pulse: 88   Resp: 18   Temp: 97.8 F (36.6 C)   TempSrc: Oral  SpO2: 100%   Weight:  72.6 kg     General appearance: alert; no distress Eyes: PERRL; EOMI; conjunctiva normal HENT: normocephalic; atraumatic;oral mucosa normal Neck: supple Extremities: no cyanosis or edema; symmetrical with no gross deformities Skin: warm and dry Neurologic: normal gait; grossly normal Psychological: alert and cooperative; normal mood and affect      Labs:  Results for orders placed or performed in visit on 03/07/17  CMP14+EGFR  Result Value Ref Range   Glucose 100 (H) 65 - 99 mg/dL   BUN 9 6 - 20 mg/dL   Creatinine, Ser 0.69 0.57 - 1.00 mg/dL   GFR calc non Af Amer 121 >59 mL/min/1.73   GFR calc Af Amer 140 >59 mL/min/1.73   BUN/Creatinine Ratio 13 9 - 23   Sodium 140 134 - 144 mmol/L   Potassium 4.4 3.5 - 5.2 mmol/L   Chloride 103 96 - 106 mmol/L   CO2 26 20 - 29 mmol/L   Calcium 8.7 8.7 - 10.2 mg/dL   Total Protein 6.2 6.0 - 8.5 g/dL   Albumin 3.5 3.5 - 5.5 g/dL    Globulin, Total 2.7 1.5 - 4.5 g/dL   Albumin/Globulin Ratio 1.3 1.2 - 2.2   Bilirubin Total 0.3 0.0 - 1.2 mg/dL   Alkaline Phosphatase 65 39 - 117 IU/L   AST 13 0 - 40 IU/L   ALT 12 0 - 32 IU/L  Lipid panel  Result Value Ref Range   Cholesterol, Total 186 100 - 199 mg/dL   Triglycerides 98 0 - 149 mg/dL   HDL 40 >39 mg/dL   VLDL Cholesterol Cal 20 5 - 40 mg/dL   LDL Calculated 126 (H) 0 - 99 mg/dL   Chol/HDL Ratio 4.7 (H) 0.0 - 4.4 ratio  CBC  Result Value Ref Range   WBC 9.0 3.4 - 10.8 x10E3/uL   RBC 5.26 3.77 - 5.28 x10E6/uL   Hemoglobin 13.0 11.1 - 15.9 g/dL   Hematocrit 40.6 34.0 - 46.6 %   MCV 77 (L) 79 - 97 fL   MCH 24.7 (L) 26.6 - 33.0 pg   MCHC 32.0 31.5 - 35.7 g/dL   RDW 16.0 (H) 12.3 - 15.4 %   Platelets 388 (H) 150 - 379 x10E3/uL  HIV antibody  Result Value Ref Range   HIV Screen 4th Generation wRfx Non Reactive Non Reactive    Labs Reviewed - No data to display  No results found.     ASSESSMENT & PLAN:  1. Migraine with status migrainosus, not intractable, unspecified migraine type     Meds ordered this encounter  Medications  . ketorolac (TORADOL) injection 60 mg  . dexamethasone (DECADRON) injection 10 mg  . traMADol (ULTRAM) 50 MG tablet    Sig: Take 1 tablet (50 mg total) by mouth every 6 (six) hours as needed.    Dispense:  10 tablet    Refill:  0    Reviewed expectations re: course of current medical issues. Questions answered. Outlined signs and symptoms indicating need for more acute intervention. Patient verbalized understanding. After Visit Summary given.    Procedures:      Robyn Haber, MD 01/01/18 1858

## 2018-03-27 ENCOUNTER — Other Ambulatory Visit: Payer: Self-pay

## 2018-03-27 ENCOUNTER — Encounter: Payer: Self-pay | Admitting: Family Medicine

## 2018-03-27 ENCOUNTER — Ambulatory Visit (INDEPENDENT_AMBULATORY_CARE_PROVIDER_SITE_OTHER): Payer: Self-pay | Admitting: Family Medicine

## 2018-03-27 VITALS — BP 110/70 | HR 97 | Temp 98.2°F | Ht 61.0 in | Wt 144.0 lb

## 2018-03-27 DIAGNOSIS — R051 Acute cough: Secondary | ICD-10-CM | POA: Insufficient documentation

## 2018-03-27 DIAGNOSIS — R05 Cough: Secondary | ICD-10-CM

## 2018-03-27 DIAGNOSIS — R059 Cough, unspecified: Secondary | ICD-10-CM | POA: Insufficient documentation

## 2018-03-27 DIAGNOSIS — Z23 Encounter for immunization: Secondary | ICD-10-CM

## 2018-03-27 MED ORDER — FAMOTIDINE 10 MG PO TABS: 10.0000 mg | ORAL_TABLET | Freq: Two times a day (BID) | ORAL | 0 refills | Status: DC

## 2018-03-27 MED ORDER — ALBUTEROL SULFATE HFA 108 (90 BASE) MCG/ACT IN AERS: 2.0000 | INHALATION_SPRAY | Freq: Four times a day (QID) | RESPIRATORY_TRACT | 2 refills | Status: DC | PRN

## 2018-03-27 NOTE — Progress Notes (Signed)
    Subjective:  Andrea [name] is a 27 y.o. female who presents to the [facility] today with a chief complaint of nighttime cough.   HPI: Andrea [name] has noted an on and off night time cough since the birth of her youngest daughter 70 months ago.  It seems to present in episodes of about 2 weeks.  The coughing is most intense at night and has been significant enough to cause dry heaving.  She occasionally feels stabbing substernal pain with the coughing that radiates down her arm.  Leading up to a coughing episode at night, she will notice increased shortness of breath.  She denies fevers, sinus congestion, nasal drainage, sick contacts, sputum production. She experienced significant gastric reflux during all three of her pregnancies. She has a history of childhood asthma for which she took albuterol, Qvar and Advair.  She has not taken any medication for asthma for at least 12 years.  Objective:  Physical Exam: BP 110/70 (BP Location: Left Arm, Patient Position: Sitting, Cuff Size: Normal)   Pulse 97   Temp 98.2 F (36.8 C) (Oral)   Ht 5\' 1"  (1.549 m)   Wt 144 lb (65.3 kg)   SpO2 100%   BMI 27.21 kg/m    Gen: NAD, resting comfortably HEENT: no cervical lymphadenopathy, oropharynx nonerythematous, no tonsillar enlargement CV: RRR with no murmurs appreciated Pulm: normal respiratory effort on room air, CTAB with no crackles, wheezes, or rhonchi   No results found for this or any previous visit (from the past 72 hour(s)).   Assessment/Plan:  Cough Persistent nighttime cough for 18 months.  Differential includes: GERD, Asthma, post nasal drip.  Will treat for both GERD and asthma until PFTs can be done. -Pepcid 10 mg BID -Albuterol inhaler (do not used for first three days to see if coughing is addressed by the pepcid) -f/u for PFTs

## 2018-03-27 NOTE — Patient Instructions (Signed)
It was great to meet you today.  Your symptoms right now seem most consistent with either Asthma or reflux.  For now, we are going to treat both until we can do more testing for asthma.  For the first 3 days, try taking only the Pepcid (heartburn medication). If the coughing persists, take the Albuterol inhaler as needed.  Come back for your pulmonary function tests which will give [place] the extra information to only treat one problem.

## 2018-03-27 NOTE — Assessment & Plan Note (Signed)
Persistent nighttime cough for 18 months.  Differential includes: GERD, Asthma, post nasal drip.  Will treat for both GERD and asthma until PFTs can be done. -Pepcid 10 mg BID -Albuterol inhaler (do not used for first three days to see if coughing is addressed by the pepcid) -f/u for PFTs

## 2018-04-14 ENCOUNTER — Ambulatory Visit (INDEPENDENT_AMBULATORY_CARE_PROVIDER_SITE_OTHER): Payer: Self-pay | Admitting: Pharmacist

## 2018-04-14 ENCOUNTER — Encounter: Payer: Self-pay | Admitting: Pharmacist

## 2018-04-14 DIAGNOSIS — R0600 Dyspnea, unspecified: Secondary | ICD-10-CM | POA: Insufficient documentation

## 2018-04-14 DIAGNOSIS — R0602 Shortness of breath: Secondary | ICD-10-CM

## 2018-04-14 MED ORDER — FLUTICASONE FUROATE-VILANTEROL 200-25 MCG/INH IN AEPB: 1.0000 | INHALATION_SPRAY | Freq: Every day | RESPIRATORY_TRACT | 0 refills | Status: DC

## 2018-04-14 NOTE — Progress Notes (Signed)
Patient ID: Marrianne MoodMariah Gagen, female   DOB: March 26, 1991, 26 y.o.   MRN: [id number] Reviewed: Agree with Dr. Macky LowerKoval's documentation and management.

## 2018-04-14 NOTE — Assessment & Plan Note (Signed)
Patient has been experiencing shortness of breath and gasping for 7 months and taking albuterol inhaler daily which has relieved symptoms. Pt claims that since having her 3rd child the symptoms have been more noticeable and she has relied more on albuterol inhaler. 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 Spirometry normal since using albuterol 4-5 hours before test. Given hx of asthma as a child (used Advair), will initiate Breo.  -Started Breo (vilanterol and fluticasone) 200/25 once daily inhalation   -Educated patient on purpose, proper use, potential adverse effects including risk of esophageal candidiasis and need to rinse mouth after each use.  -Reviewed results of pulmonary function tests.

## 2018-04-14 NOTE — Patient Instructions (Addendum)
Your lung function test showed near normal lung function.   Please take your BREO inhaler once daily until your next appointment on December 11th.

## 2018-04-14 NOTE — Progress Notes (Signed)
   S:    Patient arrives appearing well and ambulating well.    Presents for lung function evaluation.  Patient was referred by Dr. Homero FellersFrank (referred on Mar 27, 2018).    Patient reports breathing has been difficult upon exertion and relieved by albuterol inhaler for "at least an hour".   Patient reports adherence to medications Patient reports last dose of asthma medications was 6AM today (~ 4.5 hours prior) Current asthma medications: asthma Rescue inhaler use frequency: daily  Level of asthma sx control- in the last 4 weeks: Question Scoring Patient Score  Daytime sx more than twice a week Yes (1) No (0)  Yes  Any nighttime waking due to asthma Yes (1) No (0)    Reliever needed >2x/week Yes (1) No (0)  Yes  Any activity limitation due to asthma Yes (1) No (0)  Yes   Total Score  3  Well controlled - 0, partly controlled - 1-2, uncontrolled 3-4  O: Physical Exam  Constitutional: She appears well-developed and well-nourished.     Review of Systems  Respiratory: Positive for shortness of breath. Negative for wheezing.        With Exertion     There were no vitals filed for this visit.   See "scanned report" or Documentation Flowsheet (discrete results - PFTs) for  Spirometry results. Patient provided good effort while attempting spirometry.   Lung Age = 27 yo  A/P: Patient has been experiencing shortness of breath and gasping for 7 months and taking albuterol inhaler daily which has relieved symptoms. Pt claims that since having her 3rd child the symptoms have been more noticeable and she has relied more on albuterol inhaler. 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 Spirometry normal since using albuterol 4-5 hours before test. Given hx of asthma as a child (used Advair), will initiate Breo.  -Started Breo (vilanterol and fluticasone) 200/25 once daily inhalation   -Educated patient on purpose, proper use, potential adverse  effects including risk of esophageal candidiasis and need to rinse mouth after each use.  -Reviewed results of pulmonary function tests.     Patient verbalized understanding of results and education.  Written pt instructions provided.   F/U Rx Clinic visit 04/30/2018.   Total time in face to face counseling 25 minutes.  Patient seen with Belva Ageeolin Smith, PharmD Candidate.  .Marland Kitchen

## 2018-04-29 NOTE — Progress Notes (Signed)
Subjective:   Andrea [name] is a 27 y.o. female with a history of Bipolar I disorder, RND, familial hypercholesterolemia here for an annual gynecological exam.   Gyn concerns/Preventative healthcare  Last menstrual period: No LMP recorded. 1 month ago, supposed to be getting period in next couple of days.   Regular periods: regular  Heavy bleeding: yes   Sexually active: yes  Contraception or hormonal therapy:currently using condoms only, desires depo and eventually BTL  Hx of STD: Patient denies STD exposure but desires STD screening  Dyspareunia: No  Hot flashes: No  Vaginal discharge: no  Dysuria:No   Last mammogram: N/A  Breast mass or concerns: No  Last pap smear: cant remember but maybe 2 years ago. Was abnormal and was told to get one q226months.      PMH, Surgical Hx, Family Hx, Social History reviewed and updated as below.  Past Medical History:  Diagnosis Date  . Ovarian cyst   . Pregnant   . RND (reflex neurovascular dystrophy)    Past Surgical History:  Procedure Laterality Date  . TONSILLECTOMY AND ADENOIDECTOMY  2003   Family History  Problem Relation Age of Onset  . Breast cancer Mother [id number]  . Hyperlipidemia Father   . Alcoholism Father   . Diabetes Sister   . Post-traumatic stress disorder Sister   . Colon cancer Maternal Grandmother   . Diabetes Mellitus II Paternal Grandmother    Social History   Socioeconomic History  . Marital status: Significant Other    Spouse name: Not on file  . Number of children: Not on file  . Years of education: Not on file  . Highest education level: Not on file  Occupational History  . Occupation: [employer]   Social Needs  . Financial resource strain: Not on file  . Food insecurity:    Worry: Not on file    Inability: Not on file  . Transportation needs:    Medical: Not on file    Non-medical: Not on file  Tobacco Use  . Smoking status: Never Smoker  . Smokeless tobacco: Never Used  Substance  and Sexual Activity  . Alcohol use: No  . Drug use: No  . Sexual activity: Yes    Birth control/protection: Condom    Comment: nexplanon  Lifestyle  . Physical activity:    Days per week: Not on file    Minutes per session: Not on file  . Stress: Not on file  Relationships  . Social connections:    Talks on phone: Not on file    Gets together: Not on file    Attends religious service: Not on file    Active member of club or organization: Not on file    Attends meetings of clubs or organizations: Not on file    Relationship status: Not on file  Other Topics Concern  . Not on file  Social History Narrative   Lives with kids and Fayrene FearingJames. Has been with Fayrene FearingJames for 15 years, since 6th grade.     Review of Systems:  Per HPI. Otherwise a complete 10 point ROS was negative.    Objective:   Vitals:   04/30/18 0909  BP: 110/65  Pulse: 67  Temp: 98.2 F (36.8 C)  SpO2: 100%   Exam: General: well appearing, NAD. HEENT: NCAT. Cardiovascular: RRR. No murmurs, rubs, or gallops. Respiratory: CTAB. No rales, rhonchi, or wheeze. Abdomen: soft, nontender, nondistended. Extremities: warm, well perfused. No LE edema. Skin: Warm, dry, intact. Neuro: No  focal deficits.  Pelvic Exam:        External: normal female genitalia without lesions or masses        Vagina: normal without lesions or masses        Cervix: small lesion noted at 12 o'clock position, friability of cervix during pap smear, small bleeding around cervical os         Pap smear: performed        Samples for Wet prep, GC/Chlamydia obtained    Chemistry      Component Value Date/Time   NA 140 03/07/2017 1222   K 4.4 03/07/2017 1222   CL 103 03/07/2017 1222   CO2 26 03/07/2017 1222   BUN 9 03/07/2017 1222   CREATININE 0.69 03/07/2017 1222      Component Value Date/Time   CALCIUM 8.7 03/07/2017 1222   ALKPHOS 65 03/07/2017 1222   AST 13 03/07/2017 1222   ALT 12 03/07/2017 1222   BILITOT 0.3 03/07/2017 1222       Lab Results  Component Value Date   WBC 9.0 03/07/2017   HGB 13.0 03/07/2017   HCT 40.6 03/07/2017   MCV 77 (L) 03/07/2017   PLT 388 (H) 03/07/2017   No results found for: TSH No results found for: HGBA1C   Depression screen [facility] 2/9 04/30/2018 03/27/2018  Decreased Interest 3 0  Down, Depressed, Hopeless 2 0  PHQ - 2 Score 5 0  Altered sleeping 3 -  Tired, decreased energy 3 -  Change in appetite 3 -  Feeling bad or failure about yourself  3 -  Trouble concentrating 0 -  Moving slowly or fidgety/restless 3 -  Suicidal thoughts 1 -  PHQ-9 Score 21 -    Assessment/Plan:   Depression Patient with elevated PHQ 9 score of 21 today.  Patient was also slightly tearful when discussing elevated score and depression.  Encourage patient to have warm handoff with behavioral health however patient refused.  Patient states it is very difficult for her to talk about these problems.  Patient did not want to discuss today but stated that she would make a follow-up appointment to discuss depression.  Feel that patient would benefit from close follow-up with either PCP or psychiatry as well as behavioral health.  Patient denies current suicidal ideations.  Advised patient to go to emergency department if having thoughts of harming self.  Follow-up in 1 week.  Hyperlipidemia, familial, high LDL lipid panel obtained today  Healthcare maintenance Pap smear obtained today.  Likely will be abnormal as patient states every single Pap smear she has has been abnormal.  Patient reports that she was supposed [place] have colpos[place] in [place] however never made appointment because she was scared.  Discussed likely possibility that patient will need colposcopy in the future.  Baseline labs of BMP and CBC as well as lipid panel obtained today.  Tdap vaccination given.  Patient has already had flu vaccine.  Contraceptive management Patient states that she desires permanent sterilization with BTL.  States  that her and partner do not desire any further children.  Urine pregnancy negative today.  States that she has not been sexually active since March 2019 as she is scared she will get pregnant.  Patient requesting Depo-Provera shot today to help prevent pregnancy until she can have her tubal ligation surgery.  Referral to OB/GYN placed for surgical consultation.    Return in about 1 week (around 05/07/2018).   Oralia Manis, DO, PGY-2

## 2018-04-30 ENCOUNTER — Ambulatory Visit (INDEPENDENT_AMBULATORY_CARE_PROVIDER_SITE_OTHER): Payer: Self-pay | Admitting: Family Medicine

## 2018-04-30 ENCOUNTER — Ambulatory Visit: Payer: Self-pay | Admitting: Family Medicine

## 2018-04-30 ENCOUNTER — Encounter: Payer: Self-pay | Admitting: Family Medicine

## 2018-04-30 ENCOUNTER — Other Ambulatory Visit (HOSPITAL_COMMUNITY)
Admission: RE | Admit: 2018-04-30 | Discharge: 2018-04-30 | Disposition: A | Discharge status: Home / Self Care | Payer: Self-pay | Source: Ambulatory Visit | Attending: Family Medicine | Admitting: Family Medicine

## 2018-04-30 ENCOUNTER — Telehealth: Payer: Self-pay

## 2018-04-30 VITALS — BP 110/65 | HR 67 | Temp 98.2°F | Wt 144.0 lb

## 2018-04-30 DIAGNOSIS — E7849 Other hyperlipidemia: Secondary | ICD-10-CM

## 2018-04-30 DIAGNOSIS — Z0000 Encounter for general adult medical examination without abnormal findings: Secondary | ICD-10-CM

## 2018-04-30 DIAGNOSIS — Z01419 Encounter for gynecological examination (general) (routine) without abnormal findings: Secondary | ICD-10-CM | POA: Insufficient documentation

## 2018-04-30 DIAGNOSIS — Z3202 Encounter for pregnancy test, result negative: Secondary | ICD-10-CM

## 2018-04-30 DIAGNOSIS — Z23 Encounter for immunization: Secondary | ICD-10-CM

## 2018-04-30 DIAGNOSIS — Z309 Encounter for contraceptive management, unspecified: Secondary | ICD-10-CM | POA: Insufficient documentation

## 2018-04-30 DIAGNOSIS — F32A Depression, unspecified: Secondary | ICD-10-CM

## 2018-04-30 DIAGNOSIS — Z3042 Encounter for surveillance of injectable contraceptive: Secondary | ICD-10-CM

## 2018-04-30 DIAGNOSIS — Z308 Encounter for other contraceptive management: Secondary | ICD-10-CM

## 2018-04-30 DIAGNOSIS — F329 Major depressive disorder, single episode, unspecified: Secondary | ICD-10-CM

## 2018-04-30 DIAGNOSIS — Z3009 Encounter for other general counseling and advice on contraception: Secondary | ICD-10-CM

## 2018-04-30 LAB — POCT WET PREP (WET MOUNT)
Clue Cells Wet Prep Whiff POC: NEGATIVE
Trichomonas Wet Prep HPF POC: ABSENT

## 2018-04-30 LAB — POCT URINE PREGNANCY: PREG TEST UR: NEGATIVE

## 2018-04-30 MED ORDER — MEDROXYPROGESTERONE ACETATE 150 MG/ML IM SUSP
150.0000 mg | Freq: Once | INTRAMUSCULAR | Status: AC
  Administered 2018-04-30: 150 mg via INTRAMUSCULAR

## 2018-04-30 NOTE — Patient Instructions (Signed)
It was a pleasure seeing you today.   Today we discussed your annual physical   For your physical: I will call with pap smear results  For your bipolar/depression: please schedule a follow up visit as soon as you can. It can be with another provider if I do not have open slots.   Please follow up in 1 week or sooner if symptoms persist or worsen. Please call the clinic immediately if you have any concerns.   Our clinic's number is [phone]. Please call with questions or concerns.   Please go to the emergency room if you have thoughts of harming yourself or others.   Thank you,  Oralia ManisSherin Nyilah Kight, DO

## 2018-04-30 NOTE — Assessment & Plan Note (Signed)
Patient states that she desires permanent sterilization with BTL.  States that her and partner do not desire any further children.  Urine pregnancy negative today.  States that she has not been sexually active since March 2019 as she is scared she will get pregnant.  Patient requesting Depo-Provera shot today to help prevent pregnancy until she can have her tubal ligation surgery.  Referral to OB/GYN placed for surgical consultation.

## 2018-04-30 NOTE — Assessment & Plan Note (Signed)
Pap smear obtained today.  Likely will be abnormal as patient states every single Pap smear she has has been abnormal.  Patient reports that she was supposed to have colposcopy in [place] however never made appointment because she was scared.  Discussed likely possibility that patient will need colposcopy in the future.  Baseline labs of BMP and CBC as well as lipid panel obtained today.  Tdap vaccination given.  Patient has already had flu vaccine.

## 2018-04-30 NOTE — Assessment & Plan Note (Signed)
lipid panel obtained today

## 2018-04-30 NOTE — Telephone Encounter (Signed)
Informed patient of results from WET Prep.  Andrea [name].Andrea [name] R, CMA

## 2018-04-30 NOTE — Assessment & Plan Note (Signed)
Patient with elevated PHQ 9 score of 21 today.  Patient was also slightly tearful when discussing elevated score and depression.  Encourage patient to have warm handoff with behavioral health however patient refused.  Patient states it is very difficult for her to talk about these problems.  Patient did not want to discuss today but stated that she would make a follow-up appointment to discuss depression.  Feel that patient would benefit from close follow-up with either PCP or psychiatry as well as behavioral health.  Patient denies current suicidal ideations.  Advised patient to go to emergency department if having thoughts of harming self.  Follow-up in 1 week.

## 2018-04-30 NOTE — Progress Notes (Signed)
Pregnancy test was neg and Dep was given in the RUQ.  Patient tolerated it well and was given a reminder card for Feb 26- Mar 12.  Andrea [name].Brynden Thune R, CMA

## 2018-04-30 NOTE — Addendum Note (Signed)
Addended by: Gilberto BetterSIMPSON, Jarica Plass R on: 04/30/2018 12:31 PM   Modules accepted: Orders

## 2018-05-01 ENCOUNTER — Other Ambulatory Visit: Payer: Self-pay | Admitting: Family Medicine

## 2018-05-01 DIAGNOSIS — E7801 Familial hypercholesterolemia: Secondary | ICD-10-CM

## 2018-05-01 LAB — COMPREHENSIVE METABOLIC PANEL
A/G RATIO: 1.4 (ref 1.2–2.2)
ALBUMIN: 3.4 g/dL — AB (ref 3.5–5.5)
ALT: 7 IU/L (ref 0–32)
AST: 7 IU/L (ref 0–40)
Alkaline Phosphatase: 55 IU/L (ref 39–117)
BUN / CREAT RATIO: 11 (ref 9–23)
BUN: 7 mg/dL (ref 6–20)
Bilirubin Total: 0.2 mg/dL (ref 0.0–1.2)
CALCIUM: 8.5 mg/dL — AB (ref 8.7–10.2)
CO2: 24 mmol/L (ref 20–29)
Chloride: 103 mmol/L (ref 96–106)
Creatinine, Ser: 0.62 mg/dL (ref 0.57–1.00)
GFR calc Af Amer: 144 mL/min/{1.73_m2} (ref >59)
GFR calc non Af Amer: 125 mL/min/{1.73_m2} (ref >59)
GLOBULIN, TOTAL: 2.5 g/dL (ref 1.5–4.5)
GLUCOSE: 96 mg/dL (ref 65–99)
POTASSIUM: 4.2 mmol/L (ref 3.5–5.2)
SODIUM: 139 mmol/L (ref 134–144)
Total Protein: 5.9 g/dL — ABNORMAL LOW (ref 6.0–8.5)

## 2018-05-01 LAB — CBC
Hematocrit: 38.8 % (ref 34.0–46.6)
Hemoglobin: 12.1 g/dL (ref 11.1–15.9)
MCH: 24 pg — ABNORMAL LOW (ref 26.6–33.0)
MCHC: 31.2 g/dL — ABNORMAL LOW (ref 31.5–35.7)
MCV: 77 fL — ABNORMAL LOW (ref 79–97)
Platelets: 415 10*3/uL (ref 150–450)
RBC: 5.05 x10E6/uL (ref 3.77–5.28)
RDW: 15.7 % — AB (ref 12.3–15.4)
WBC: 7.6 10*3/uL (ref 3.4–10.8)

## 2018-05-01 LAB — LIPID PANEL
CHOL/HDL RATIO: 5.2 ratio — AB (ref 0.0–4.4)
Cholesterol, Total: 165 mg/dL (ref 100–199)
HDL: 32 mg/dL — ABNORMAL LOW (ref >39)
LDL Calculated: 118 mg/dL — ABNORMAL HIGH (ref 0–99)
Triglycerides: 76 mg/dL (ref 0–149)
VLDL Cholesterol Cal: 15 mg/dL (ref 5–40)

## 2018-05-01 LAB — CERVICOVAGINAL ANCILLARY ONLY
Chlamydia: NEGATIVE
NEISSERIA GONORRHEA: NEGATIVE

## 2018-05-02 ENCOUNTER — Telehealth: Payer: Self-pay | Admitting: *Deleted

## 2018-05-02 LAB — CYTOLOGY - PAP: DIAGNOSIS: NEGATIVE

## 2018-05-02 NOTE — Telephone Encounter (Signed)
-----   Message from Oralia ManisSherin Abraham, DO sent at 05/02/2018  8:13 AM EST ----- Please inform patient that results are negative.

## 2018-05-02 NOTE — Telephone Encounter (Signed)
Pt informed. Dezi Brauner, CMA  

## 2018-05-12 ENCOUNTER — Encounter: Payer: Self-pay | Admitting: Obstetrics and Gynecology

## 2018-05-19 ENCOUNTER — Ambulatory Visit: Payer: Self-pay | Admitting: Family Medicine

## 2018-06-12 ENCOUNTER — Ambulatory Visit (INDEPENDENT_AMBULATORY_CARE_PROVIDER_SITE_OTHER): Payer: BLUE CROSS/BLUE SHIELD | Admitting: Internal Medicine

## 2018-06-12 ENCOUNTER — Encounter: Payer: Self-pay | Admitting: Internal Medicine

## 2018-06-12 VITALS — BP 112/76 | HR 85 | Ht 61.0 in | Wt 143.2 lb

## 2018-06-12 DIAGNOSIS — R5383 Other fatigue: Secondary | ICD-10-CM

## 2018-06-12 DIAGNOSIS — H0260 Xanthelasma of unspecified eye, unspecified eyelid: Secondary | ICD-10-CM | POA: Diagnosis not present

## 2018-06-12 DIAGNOSIS — R0683 Snoring: Secondary | ICD-10-CM

## 2018-06-12 DIAGNOSIS — R400 Somnolence: Secondary | ICD-10-CM

## 2018-06-12 NOTE — Progress Notes (Signed)
LIPID CLINIC CONSULT NOTE  Chief Complaint:  Xanthelasma  Primary Care Physician: Oralia Manis, DO  HPI:  Andrea [name] is a 28 y.o. 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  This was apparently picked up by her primary care provider.  There is concern for possible familial hyperlipidemia.  She does have a history of elevated cholesterol in the past however recent lipid profile was actually unremarkable.  Her total cholesterol was 165, triglyceride 76, HDL 32 and LDL 118.  She reportedly eats a fairly healthy diet.  She eats very few fried foods.  Unfortunately she does have a history of alcohol use.  Was apparently very heavy and she stopped about 3 months ago.  She also has a remote history of substance abuse.  She has 3 children and is a single mom and is caring for them.  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  This seems very unusual given her young age but according to the ophthalmologist it is possible.  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  Family history does not suggest significant early onset heart disease although there is some heart disease and high cholesterol in her father and her grandmother on her father's side.  In addition she reports daytime fatigue, somnolence, loud snoring and periods of apnea that wake her up.  Although she has no one regularly around to listen to her, she is concerned about a sleep disorder.  Her EPWSS today is 10, suggesting possible increased risk for sleep apnea.  PMHx:  Past Medical History:  Diagnosis Date  . Ovarian cyst   . Pregnant   . RND (reflex neurovascular dystrophy)     Past Surgical History:  Procedure Laterality Date  . TONSILLECTOMY AND ADENOIDECTOMY  2003    FAMHx:  Family History  Problem Relation Age of Onset   . Breast cancer Mother 44  . Hyperlipidemia Father   . Alcoholism Father   . Diabetes Sister   . Post-traumatic stress disorder Sister   . Colon cancer Maternal Grandmother   . Diabetes Mellitus II Paternal Grandmother     SOCHx:   reports that she has never smoked. She has never used smokeless tobacco. She reports that she does not drink alcohol or use drugs.  ALLERGIES:  Allergies  Allergen Reactions  . Cymbalta [Duloxetine Hcl] Other (See Comments)    BLACK OUT with first dose - "causes me to feel more suicidal"  . Ultrasound Gel Hives and Rash    Denies airway involvement    ROS: Pertinent items noted in HPI and remainder of comprehensive ROS otherwise negative.  HOME MEDS: Current Outpatient Medications on File Prior to Visit  Medication Sig Dispense Refill  . albuterol (PROVENTIL HFA;VENTOLIN HFA) 108 (90 Base) MCG/ACT inhaler Inhale 2 puffs into the lungs every 6 (six) hours as needed for wheezing or shortness of breath. 1 Inhaler 2  . famotidine (PEPCID AC) 10 MG tablet Take 1 tablet (10 mg total) by mouth 2 (two) times daily. 60 tablet 0  . fluticasone furoate-vilanterol (BREO ELLIPTA) 200-25 MCG/INH AEPB Inhale 1 puff into the lungs daily. 1 each 0  . latanoprost (XALATAN) 0.005 % ophthalmic solution Place 1 drop into both eyes at bedtime.    . traMADol (ULTRAM) 50 MG tablet Take 1 tablet (  50 mg total) by mouth every 6 (six) hours as needed. (Patient not taking: Reported on 04/14/2018) 10 tablet 0   No current facility-administered medications on file prior to visit.     LABS/IMAGING: No results found for this or any previous visit (from the past 48 hour(s)). No results found.  LIPID PANEL:    Component Value Date/Time   CHOL 165 04/30/2018 1000   TRIG 76 04/30/2018 1000   HDL 32 (L) 04/30/2018 1000   CHOLHDL 5.2 (H) 04/30/2018 1000   LDLCALC 118 (H) 04/30/2018 1000    WEIGHTS: Wt Readings from Last 3 Encounters:  06/12/18 143 lb 3.2 oz (65 kg)    04/30/18 144 lb (65.3 kg)  04/14/18 147 lb (66.7 kg)    VITALS: BP 112/76 Comment: right arm  Pulse 85   Ht 5\' 1"  (1.549 m)   Wt 143 lb 3.2 oz (65 kg)   BMI 27.06 kg/m   EXAM: General appearance: alert and no distress Neck: no carotid bruit, no JVD and thyroid not enlarged, symmetric, no tenderness/mass/nodules Lungs: clear to auscultation bilaterally Heart: regular rate and rhythm Abdomen: soft, non-tender; bowel sounds normal; no masses,  no organomegaly Extremities: extremities normal, atraumatic, no cyanosis or edema Pulses: 2+ and symmetric Skin: Skin color, texture, turgor normal. No rashes or lesions Neurologic: Grossly normal Psych: Pleasant  EKG: Deferred  ASSESSMENT: 1. Xanthelasma 2. Snoring, excessive daytime sleepiness, nonrestorative sleep  PLAN: 1.   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  Surprisingly, her lipid profile is unremarkable.  This finding is unusual in a young individual.  It could be related with liver diseases such as primary biliary cirrhosis (PBC).  There is no evidence that she has any underlying liver disease although she does have a history of heavy alcohol use and abstinence only for about 3 months.  She also has previous history of drug use.  I am concerned also about her daytime fatigue.  I do not think this is typical daytime fatigue related to being a single mother of 3 children.  She reports heavy snoring which is loud and periods of interrupted sleep for which she awakens gasping.  This is suggestive of sleep apnea.  I would recommend a home sleep study to accommodate her schedule.  We will plan follow-up on that.  We may need to do extended lipid testing to make sure were not missing another cause of her xanthelasma, but at this time, she does not really meet criteria for treatment of dyslipidemia based on her traditional lipid profile.  Thanks for the interesting referral.  Follow-up with me  in a few months.  Chrystie NoseKenneth C. Khaila Velarde, MD, [facility] HospitalFACC, FACP  [place]  [facility]  Medical Director of the Advanced Lipid Disorders &  Cardiovascular Risk Reduction Clinic Diplomate of the American Board of Clinical Lipidology Attending Cardiologist  Direct Dial: [phone]  Fax: [phone]  Website:  [email or web address]  Azriel Dancy C Puneet Masoner 06/12/2018, 4:50 PM

## 2018-06-12 NOTE — Patient Instructions (Signed)
Medication Instructions:  NO CHANGES  If you need a refill on your cardiac medications before your next appointment, please call your pharmacy.   Testing/Procedures: Dr. Rennis Golden has ordered a sleep study  Burna Mortimer CMA (office sleep coordinator) will pre-authorize this with your insurance and then you will be called to schedule an appointment.  Follow-Up: At [facility], you and your health needs are our priority.  As part of our continuing mission to provide you with exceptional heart care, we have created designated Provider Care Teams.  These Care Teams include your primary Cardiologist (physician) and Advanced Practice Providers (APPs -  Physician Assistants and Nurse Practitioners) who all work together to provide you with the care you need, when you need it. . You will need a follow up appointment in 2 months with Dr. Rennis Golden (after sleep study)  Any Other Special Instructions Will Be Listed Below (If Applicable).

## 2018-06-13 ENCOUNTER — Telehealth: Payer: Self-pay | Admitting: *Deleted

## 2018-06-13 ENCOUNTER — Other Ambulatory Visit: Payer: Self-pay | Admitting: Internal Medicine

## 2018-06-13 DIAGNOSIS — R0683 Snoring: Secondary | ICD-10-CM

## 2018-06-13 DIAGNOSIS — R400 Somnolence: Secondary | ICD-10-CM

## 2018-06-13 NOTE — Telephone Encounter (Signed)
Patient notified of 07/11/18  HST appointment.

## 2018-06-13 NOTE — Telephone Encounter (Signed)
Left message to return a call to me. ( HST APPT)

## 2018-06-23 ENCOUNTER — Encounter: Payer: Self-pay | Admitting: Obstetrics & Gynecology

## 2018-07-02 ENCOUNTER — Telehealth: Payer: Self-pay | Admitting: Family Medicine

## 2018-07-02 ENCOUNTER — Emergency Department (HOSPITAL_COMMUNITY)
Admission: EM | Admit: 2018-07-02 | Discharge: 2018-07-02 | Discharge status: Against Medical Advice | Payer: BLUE CROSS/BLUE SHIELD

## 2018-07-02 ENCOUNTER — Encounter: Payer: Self-pay | Admitting: Family Medicine

## 2018-07-02 ENCOUNTER — Telehealth: Payer: Self-pay | Admitting: Internal Medicine

## 2018-07-02 ENCOUNTER — Ambulatory Visit (HOSPITAL_BASED_OUTPATIENT_CLINIC_OR_DEPARTMENT_OTHER): Payer: BLUE CROSS/BLUE SHIELD | Attending: Internal Medicine

## 2018-07-02 NOTE — ED Notes (Signed)
Registration reported that the patient left.

## 2018-07-02 NOTE — Telephone Encounter (Signed)
Spoke with [place], she states that pt went to pick up her home sleep study test. She states that the intake documents ask if you have suicidal thought or thoughts of hurting yourself and she marked yes. Jeanice Lim discussed this with her then walked her over to the ER to have her evaluated for this and right when the ED staff was ready to take her back she conveniently had to go p/u her daughter and had no one to do this for her. She just wanted Dr Rennis Golden to know why she did not have her do the sleep test. She states that until she get evaluated (by Dr Rennis Golden or PCP) for suicidal thoughts, etc... she cannot give her the sleep test. When she left she said that she is not having these thoughts right now but frequently does. Pt is Bipolar and very anxious.  Called her PCP Oralia Manis, DO [phone]), updated Shelly with this information (she is her nurse) she will discuss with Dr Darin Engels and take care of this.

## 2018-07-02 NOTE — ED Notes (Signed)
Patient was having an outpatient x-ray and answered YES, to the suicide question, "Do you have thoughts of harming/killing yourself?" Patient was walked to the ED to check in for treatment. Carney BernJean, registration, checked patient in. She reports she was calm and cooperative. She over heard patient talking with x-ray staff, that is not having any thoughts now, but had them in the past. Also, patient did ask Carney BernJean how long the wait was going to be for treatment and Carney BernJean replied that it may take a sometime for this complaint. She mentioned she needed to leave to pick her kids. This Clinical research associatewriter was notified of the occurrence after leaving another patients room. The writer never visualized patient.

## 2018-07-02 NOTE — Telephone Encounter (Signed)
I am precepting this afternoon and was contacted by [facility] CMA regarding this patient.  Patient presented for a sleep study today and on her intake questionnaire answered affirmatively to a question about whether she had had thoughts of self-harm.  She was taken to the emergency room for evaluation but was not able to stay and did not ever see a provider.  Our office was contacted to ensure patient has follow-up.  I called the patient to discuss.  She was pleasant on the phone.  States she does have bipolar disorder and anxiety, but denies any current thoughts of harming herself.  Has no plan to harm her self.  She has had thoughts of self-harm in the past, and years ago wrote a letter but never actually attempted to hurt her self.  Reports being seen by a psychiatrist more than 10 years ago.  She struggles to talk about her issues in person with providers due to anxiety.  She did try earlier today to schedule an appointment with her PCP, but was told PCP is booked until April. Patient asserts she is safe.  As patient has no current thoughts or plans of harming herself, she does not require emergency evaluation right now. I offered to see patient tomorrow in my clinic.  Scheduled an appointment for 9:10.  She is agreeable.  I will see her then.  Patient appreciative.  FYI to PCP.  Andrea Dodrill, MD

## 2018-07-02 NOTE — Telephone Encounter (Signed)
Received phone call from Dr. Blanchie Dessert office and spoke to [facility].  Marcelino Duster indicated that during screening for Sleep Study, patient indicated that she was suicidal. Patient was taken to [facility] ED and patient refused to stay because she stated that she had to pick up her daughter.  Precepted the above information to Dr. Pollie Meyer and she called patient.  Glennie Hawk, CMA

## 2018-07-02 NOTE — Telephone Encounter (Signed)
New Message   [place] with the [facility] is needing to speak with a nurse in reference to patients appt. Connected to triage

## 2018-07-03 ENCOUNTER — Ambulatory Visit (INDEPENDENT_AMBULATORY_CARE_PROVIDER_SITE_OTHER): Payer: Self-pay | Admitting: Family Medicine

## 2018-07-03 VITALS — BP 100/70 | HR 83 | Temp 98.6°F | Wt 143.6 lb

## 2018-07-03 DIAGNOSIS — F411 Generalized anxiety disorder: Secondary | ICD-10-CM

## 2018-07-03 DIAGNOSIS — F319 Bipolar disorder, unspecified: Secondary | ICD-10-CM

## 2018-07-03 NOTE — Progress Notes (Signed)
Date of Visit: 07/03/2018   HPI:  Patient presents today to discuss anxiety.  See phone notes from yesterday regarding details of yesterday's events.  Briefly, patient presented for outpatient sleep study and checked affirmatively that she had been having thoughts of harming herself.  She was taken to the emergency room by the sleep study staff but was not able to stay to be evaluated as she needed to pick up her children from daycare.  I spoke with her on the phone yesterday and patient denied any thoughts of harming herself at that time.  She misinterpreted the question to me and if she had ever had thoughts of self-harm.  Patient does report significant anxiety.  Anxiety is mostly around going into public and being around other people.  She feels like other people are taking up her space and breathing her oxygen.  She avoids going to the grocery store during busy times because of this.  She also does not like going to doctors offices and discussing her problems.  She feels like overall she is coping pretty well and is able to adapt her lifestyle around this anxiety to minimize its impact on her.  Denies any thoughts of harming herself currently.  Has no plan to hurt her self.  Reports she has to stay alive to care for her children, as they are her world.  Has a history of bipolar disorder diagnosed when she was a teenager.  Reports a tumultuous upbringing with a mother who was not very present for her.  She is not interested in therapy at this time.  Also does not want to see a psychiatrist.  ROS: See HPI.  PMFSH: History of hyperlipidemia, reflex neurovascular dystrophy, depression/bipolar disorder  PHYSICAL EXAM: BP 100/70   Pulse 83   Temp 98.6 F (37 C) (Oral)   Wt 143 lb 9.6 oz (65.1 kg)   SpO2 98%   BMI 27.13 kg/m  Gen: No acute distress, pleasant, cooperative Psych: normal range of affect, well groomed, speech normal in rate and volume, normal eye contact    ASSESSMENT/PLAN:  Health maintenance:  -current on HM items  Bipolar 1 disorder (HCC) Carries diagnosis of bipolar disorder from when she was a teenager.  Her current symptoms sound much more based in anxiety.  However, given history of bipolar disorder I am hesitant to start any unopposed serotonergic medications.  Previously did not tolerate Seroquel due to sedation.  I think if she is going to take medication, she would be best served by being evaluated by psychiatrist for diagnostic clarity.  She does not want to see a psychiatrist, and is not interested in counseling.  I encouraged her to consider these options in the future.  At the present, she is coping well and has no thoughts of self-harm.  She knows to seek help if she were to have these thoughts.    She seems resilient and capable.  Her 59-year-old daughter was present during today's visit and I observed patient being a caring, loving mother to her daughter.  Patient will follow-up as needed.  FOLLOW UP: Follow up as needed if symptoms worsen or do not improve.   [place] J. Pollie Meyer, MD [facility]

## 2018-07-03 NOTE — Assessment & Plan Note (Signed)
Carries diagnosis of bipolar disorder from when she was a teenager.  Her current symptoms sound much more based in anxiety.  However, given history of bipolar disorder I am hesitant to start any unopposed serotonergic medications.  Previously did not tolerate Seroquel due to sedation.  I think if she is going to take medication, she would be best served by being evaluated by psychiatrist for diagnostic clarity.  She does not want to see a psychiatrist, and is not interested in counseling.  I encouraged her to consider these options in the future.  At the present, she is coping well and has no thoughts of self-harm.  She knows to seek help if she were to have these thoughts.    She seems resilient and capable.  Her 27-year-old daughter was present during today's visit and I observed patient being a caring, loving mother to her daughter.  Patient will follow-up as needed.

## 2018-07-03 NOTE — Patient Instructions (Addendum)
It was nice to meet you today.  Let us know if you decide you'd like to pursue counseling or see a psychiatrist. If you have any thoughts of hurting yourself or anyone else, go to the Emergency Room to stay safe.   Be well, Dr. Pollie Meyer

## 2018-07-14 ENCOUNTER — Ambulatory Visit: Payer: BLUE CROSS/BLUE SHIELD

## 2018-08-13 ENCOUNTER — Telehealth: Payer: Self-pay

## 2018-08-13 NOTE — Telephone Encounter (Signed)
Pt called to return Alisha's call. She said that this appt was a follow up to go over the results of a sleep study, and the pt never did the sleep study

## 2018-08-13 NOTE — Telephone Encounter (Signed)
Attempted to contact pt for COVID pre-screen. Left message to call back. 

## 2018-08-18 ENCOUNTER — Ambulatory Visit: Payer: BLUE CROSS/BLUE SHIELD | Admitting: Internal Medicine

## 2019-01-09 DIAGNOSIS — F1010 Alcohol abuse, uncomplicated: Secondary | ICD-10-CM | POA: Insufficient documentation

## 2019-01-09 DIAGNOSIS — F1111 Opioid abuse, in remission: Secondary | ICD-10-CM | POA: Insufficient documentation

## 2021-01-19 DIAGNOSIS — Z419 Encounter for procedure for purposes other than remedying health state, unspecified: Secondary | ICD-10-CM | POA: Diagnosis not present

## 2021-02-13 ENCOUNTER — Other Ambulatory Visit: Payer: Self-pay | Admitting: Obstetrics and Gynecology

## 2021-02-13 ENCOUNTER — Ambulatory Visit (INDEPENDENT_AMBULATORY_CARE_PROVIDER_SITE_OTHER): Payer: Medicaid Other

## 2021-02-13 DIAGNOSIS — O0991 Supervision of high risk pregnancy, unspecified, first trimester: Secondary | ICD-10-CM

## 2021-02-13 DIAGNOSIS — H409 Unspecified glaucoma: Secondary | ICD-10-CM | POA: Insufficient documentation

## 2021-02-13 DIAGNOSIS — Z3A10 10 weeks gestation of pregnancy: Secondary | ICD-10-CM

## 2021-02-13 DIAGNOSIS — Z8751 Personal history of pre-term labor: Secondary | ICD-10-CM

## 2021-02-13 DIAGNOSIS — O0990 Supervision of high risk pregnancy, unspecified, unspecified trimester: Secondary | ICD-10-CM | POA: Diagnosis not present

## 2021-02-13 DIAGNOSIS — Z8759 Personal history of other complications of pregnancy, childbirth and the puerperium: Secondary | ICD-10-CM

## 2021-02-13 DIAGNOSIS — O09211 Supervision of pregnancy with history of pre-term labor, first trimester: Secondary | ICD-10-CM

## 2021-02-13 DIAGNOSIS — O09299 Supervision of pregnancy with other poor reproductive or obstetric history, unspecified trimester: Secondary | ICD-10-CM

## 2021-02-13 DIAGNOSIS — F1011 Alcohol abuse, in remission: Secondary | ICD-10-CM

## 2021-02-13 DIAGNOSIS — Z8632 Personal history of gestational diabetes: Secondary | ICD-10-CM

## 2021-02-13 MED ORDER — BLOOD PRESSURE KIT DEVI: 0 refills | Status: DC

## 2021-02-13 MED ORDER — GOJJI WEIGHT SCALE MISC: 1.0000 | 0 refills | Status: DC | PRN

## 2021-02-13 NOTE — Progress Notes (Addendum)
New OB Intake  I connected with  Andrea [name] on 02/13/21 at  9:00 AM EDT by telephone Visit and verified that I am speaking with the correct person using two identifiers. Nurse is located at [facility] and pt is located at home.  I discussed the limitations, risks, security and privacy concerns of performing an evaluation and management service by telephone and the availability of in person appointments. I also discussed with the patient that there may be a patient responsible charge related to this service. The patient expressed understanding and agreed to proceed.  I explained I am completing New OB Intake today. We discussed her EDD of 09/06/2021 that is based on LMP of 11/30/2020. Pt is G6/P3. I reviewed her allergies, medications, Medical/Surgical/OB history, and appropriate screenings. Based on history, this is a/an  pregnancy complicated by Hx of preterm labor, hx of GDM, and hx of pre-eclampsia .   Concerns addressed today Pt reports intermittent pressure, bright red spotting, and "lighting shocks" in vagina after picking a case of water 2.5 weeks ago. Last IC 2 weeks ago. She also has hx of GDM and c/o having to immediately eat a honey bun in the morning or she will get dizzy and lightheaded. Pt refuse MAU visit d/t lack of childcare. Consulted with MD, cervical length u/s ordered. Pt to avoid IC and monitor sx's. Reconsider MAU visit for bright red spotting with vaginal discomfort when she has child care. Pick better food options such as milk, a handful of nuts, protein bars, and/or smoothies to manage glucose levels.   PHQ9 = 3 GAD7- 0   Delivery Plans:  Plans to deliver at [facility].   MyChart/Babyscripts MyChart access verified. I explained pt will have some visits in office and some virtually. Babyscripts instructions given and order placed. Patient verifies receipt of registration text/e-mail. Account successfully created and app downloaded.  Blood Pressure Cuff  Blood pressure  cuff ordered for patient to pick-up from [employer]. Explained after first prenatal appt pt will check weekly and document in Babyscripts.  Weight scale: Patient needs weight scale. Weight scale ordered for patient to pick up form [facility].    Labs Discussed Avelina Laine genetic screening with patient. Would like Panorama drawn at new OB visit. Routine prenatal labs needed.  Covid Vaccine Declined  Send link to Pregnancy Navigators  [facility] [employer] has a partnership with the [employer] to provide prenatal navigation for the most needed resources in our community. In order to see how we can help connect you to these resources we need consent to contact you. Please complete the very short consent using the link below:   English Link: https://[email or web address].tfaforms.net/283?site=16  Placed OB Box on problem list and updated  First visit review I reviewed new OB appt with pt. I explained she will have a pelvic exam, ob bloodwork with genetic screening, and PAP smear. Explained that patient will be seen by pregnancy navigator following visit with provider.  Dalphine Handing, CMA 02/13/2021  10:40 AM

## 2021-02-13 NOTE — Progress Notes (Signed)
Patient was assessed and managed by nursing staff during this encounter. I have reviewed the chart and agree with the documentation and plan. I have also made any necessary editorial changes.  Catalina Antigua, MD 02/13/2021 12:52 PM

## 2021-02-18 DIAGNOSIS — Z419 Encounter for procedure for purposes other than remedying health state, unspecified: Secondary | ICD-10-CM | POA: Diagnosis not present

## 2021-02-20 ENCOUNTER — Encounter: Payer: Medicaid Other | Admitting: Obstetrics

## 2021-02-22 ENCOUNTER — Other Ambulatory Visit (HOSPITAL_COMMUNITY)
Admission: RE | Admit: 2021-02-22 | Discharge: 2021-02-22 | Disposition: A | Discharge status: Home / Self Care | Payer: Medicaid Other | Source: Ambulatory Visit | Attending: Obstetrics | Admitting: Obstetrics

## 2021-02-22 ENCOUNTER — Encounter: Payer: Self-pay | Admitting: Obstetrics

## 2021-02-22 ENCOUNTER — Ambulatory Visit (INDEPENDENT_AMBULATORY_CARE_PROVIDER_SITE_OTHER): Payer: Medicaid Other | Admitting: Obstetrics

## 2021-02-22 ENCOUNTER — Other Ambulatory Visit: Payer: Self-pay

## 2021-02-22 VITALS — BP 118/75 | HR 75 | Wt 149.0 lb

## 2021-02-22 DIAGNOSIS — K219 Gastro-esophageal reflux disease without esophagitis: Secondary | ICD-10-CM

## 2021-02-22 DIAGNOSIS — O09299 Supervision of pregnancy with other poor reproductive or obstetric history, unspecified trimester: Secondary | ICD-10-CM

## 2021-02-22 DIAGNOSIS — Z8632 Personal history of gestational diabetes: Secondary | ICD-10-CM | POA: Diagnosis not present

## 2021-02-22 DIAGNOSIS — Z3480 Encounter for supervision of other normal pregnancy, unspecified trimester: Secondary | ICD-10-CM

## 2021-02-22 DIAGNOSIS — Z3A12 12 weeks gestation of pregnancy: Secondary | ICD-10-CM | POA: Diagnosis not present

## 2021-02-22 DIAGNOSIS — Z8751 Personal history of pre-term labor: Secondary | ICD-10-CM

## 2021-02-22 DIAGNOSIS — Z31430 Encounter of female for testing for genetic disease carrier status for procreative management: Secondary | ICD-10-CM | POA: Diagnosis not present

## 2021-02-22 DIAGNOSIS — O0990 Supervision of high risk pregnancy, unspecified, unspecified trimester: Secondary | ICD-10-CM | POA: Insufficient documentation

## 2021-02-22 DIAGNOSIS — F1011 Alcohol abuse, in remission: Secondary | ICD-10-CM

## 2021-02-22 DIAGNOSIS — F319 Bipolar disorder, unspecified: Secondary | ICD-10-CM

## 2021-02-22 LAB — HEPATITIS C ANTIBODY: HCV Ab: NEGATIVE

## 2021-02-22 MED ORDER — PRENATE MINI 18-0.6-0.4-350 MG PO CAPS: 1.0000 | ORAL_CAPSULE | Freq: Every day | ORAL | 11 refills | Status: DC

## 2021-02-22 MED ORDER — ASPIRIN 81 MG PO CHEW: 81.0000 mg | CHEWABLE_TABLET | Freq: Every day | ORAL | 6 refills | Status: DC

## 2021-02-22 MED ORDER — PANTOPRAZOLE SODIUM 40 MG PO TBEC: 40.0000 mg | DELAYED_RELEASE_TABLET | Freq: Every day | ORAL | 11 refills | Status: DC

## 2021-02-22 NOTE — Progress Notes (Signed)
NEW OB.  Report no complaints today.

## 2021-02-22 NOTE — Progress Notes (Signed)
Subjective:    Andrea [name] is being seen today for her first obstetrical visit.  This is not a planned pregnancy. She is at [redacted]w[redacted]d gestation. Her obstetrical history is significant for pre-eclampsia, gestational diabetes and preterm deliveries. Relationship with FOB: significant other, living together. Patient does intend to breast feed. Pregnancy history fully reviewed.  The information documented in the HPI was reviewed and verified.  Menstrual History: OB History     Gravida  6   Para  3   Term      Preterm  3   AB  2   Living  3      SAB      IAB  2   Ectopic      Multiple      Live Births              Patient's last menstrual period was 11/30/2020.    Past Medical History:  Diagnosis Date   Glaucoma    Ovarian cyst    Pregnant    RND (reflex neurovascular dystrophy)    TMJ (dislocation of temporomandibular joint)     Past Surgical History:  Procedure Laterality Date   TONSILLECTOMY AND ADENOIDECTOMY  2003    (Not in a hospital admission)  Allergies  Allergen Reactions   Cymbalta [Duloxetine Hcl] Other (See Comments)    BLACK OUT with first dose - "causes me to feel more suicidal"   Ultrasound Gel Hives and Rash    Denies airway involvement    Social History   Tobacco Use   Smoking status: Never   Smokeless tobacco: Never  Substance Use Topics   Alcohol use: Not Currently    Family History  Problem Relation Age of Onset   Breast cancer Mother 49   Hyperlipidemia Father    Alcoholism Father    Diabetes Sister    Post-traumatic stress disorder Sister    Colon cancer Maternal Grandmother    Diabetes Mellitus II Paternal Grandmother      Review of Systems Constitutional: negative for weight loss Gastrointestinal: negative for vomiting Genitourinary:negative for genital lesions and vaginal discharge and dysuria Musculoskeletal:negative for back pain Behavioral/Psych: negative for abusive relationship, depression, illegal drug usage  and tobacco use    Objective:    LMP 11/30/2020  General Appearance:    Alert, cooperative, no distress, appears stated age  Head:    Normocephalic, without obvious abnormality, atraumatic  Eyes:    PERRL, conjunctiva/corneas clear, EOM's intact, fundi    benign, both eyes  Ears:    Normal TM's and external ear canals, both ears  Nose:   Nares normal, septum midline, mucosa normal, no drainage    or sinus tenderness  Throat:   Lips, mucosa, and tongue normal; teeth and gums normal  Neck:   Supple, symmetrical, trachea midline, no adenopathy;    thyroid:  no enlargement/tenderness/nodules; no carotid   bruit or JVD  Back:     Symmetric, no curvature, ROM normal, no CVA tenderness  Lungs:     Clear to auscultation bilaterally, respirations unlabored  Chest Wall:    No tenderness or deformity   Heart:    Regular rate and rhythm, S1 and S2 normal, no murmur, rub   or gallop  Breast Exam:    No tenderness, masses, or nipple abnormality  Abdomen:     Soft, non-tender, bowel sounds active all four quadrants,    no masses, no organomegaly  Genitalia:    Normal female without lesion, discharge or  tenderness  Extremities:   Extremities normal, atraumatic, no cyanosis or edema  Pulses:   2+ and symmetric all extremities  Skin:   Skin color, texture, turgor normal, no rashes or lesions  Lymph nodes:   Cervical, supraclavicular, and axillary nodes normal  Neurologic:   CNII-XII intact, normal strength, sensation and reflexes    throughout      Lab Review Urine pregnancy test Labs reviewed yes Radiologic studies reviewed no  Assessment:    Pregnancy at [redacted]w[redacted]d weeks    Plan:   1. Supervision of high risk pregnancy, antepartum Rx: - Cytology - PAP( [place]) - Genetic Screening - Culture, OB Urine - Obstetric Panel, Including HIV - Hepatitis C Antibody - Cervicovaginal ancillary only( [place]) - [place] MFM OB COMP + 14 WK; Future - Prenat-FeCbn-FeAsp-Meth-FA-DHA (PRENATE MINI)  18-0.6-0.4-350 MG CAPS; Take 1 capsule by mouth daily.  Dispense: 30 capsule; Refill: 11  2. GERD without esophagitis Rx: - pantoprazole (PROTONIX) 40 MG tablet; Take 1 tablet (40 mg total) by mouth daily.  Dispense: 30 tablet; Refill: 11  3. Bipolar disorder with depression [facility]) Rx: - Ambulatory referral to Integrated Behavioral [facility]  4. History of gestational diabetes Rx: - Hemoglobin A1c  5. History of pre-eclampsia in prior pregnancy, currently pregnant Rx: - aspirin 81 MG chewable tablet; Chew 1 tablet (81 mg total) by mouth daily.  Dispense: 30 tablet; Refill: 6  6. History of preterm delivery  7. History of alcohol abuse     Prenatal vitamins.  Counseling provided regarding continued use of seat belts, cessation of alcohol consumption, smoking or use of illicit drugs; infection precautions i.e., influenza/TDAP immunizations, toxoplasmosis,CMV, parvovirus, listeria and varicella; workplace safety, exercise during pregnancy; routine dental care, safe medications, sexual activity, hot tubs, saunas, pools, travel, caffeine use, fish and methlymercury, potential toxins, hair treatments, varicose veins Weight gain recommendations per IOM guidelines reviewed: underweight/BMI< 18.5--> gain 28 - 40 lbs; normal weight/BMI 18.5 - 24.9--> gain 25 - 35 lbs; overweight/BMI 25 - 29.9--> gain 15 - 25 lbs; obese/BMI >30->gain  11 - 20 lbs Problem list reviewed and updated. FIRST/CF mutation testing/NIPT/QUAD SCREEN/fragile X/Ashkenazi Jewish population testing/Spinal muscular atrophy discussed: requested. Role of ultrasound in pregnancy discussed; fetal survey: requested. Amniocentesis discussed: not indicated.   Orders Placed This Encounter  Procedures   Culture, OB Urine   [place] MFM OB COMP + 14 WK    Standing Status:   Future    Standing Expiration Date:   02/21/2022    Order Specific Question:   Reason for Exam (SYMPTOM  OR DIAGNOSIS REQUIRED)    Answer:   Anatomy    Order Specific  Question:   Preferred Location    Answer:   [facility] Ultrasound   Genetic Screening   Obstetric Panel, Including HIV   Hepatitis C Antibody   Hemoglobin A1c    History of GDM    Follow up in 4 weeks.  I have spent a total of 20 minutes of face-to-face time, excluding clinical staff time, reviewing notes and preparing to see patient, ordering tests and/or medications, and counseling the patient.   Brock Bad, MD 02/22/2021 11:04 AM

## 2021-02-23 ENCOUNTER — Ambulatory Visit
Admission: RE | Admit: 2021-02-23 | Discharge: 2021-02-23 | Disposition: A | Discharge status: Home / Self Care | Payer: Medicaid Other | Source: Ambulatory Visit | Attending: Obstetrics and Gynecology | Admitting: Obstetrics and Gynecology

## 2021-02-23 ENCOUNTER — Other Ambulatory Visit: Payer: Self-pay | Admitting: Obstetrics and Gynecology

## 2021-02-23 DIAGNOSIS — O0990 Supervision of high risk pregnancy, unspecified, unspecified trimester: Secondary | ICD-10-CM | POA: Diagnosis not present

## 2021-02-23 LAB — OBSTETRIC PANEL, INCLUDING HIV
Antibody Screen: NEGATIVE
Basophils Absolute: 0 10*3/uL (ref 0.0–0.2)
Basos: 1 %
EOS (ABSOLUTE): 0.1 10*3/uL (ref 0.0–0.4)
Eos: 1 %
HIV Screen 4th Generation wRfx: NONREACTIVE
Hematocrit: 37.1 % (ref 34.0–46.6)
Hemoglobin: 12.2 g/dL (ref 11.1–15.9)
Hepatitis B Surface Ag: NEGATIVE
Immature Grans (Abs): 0 10*3/uL (ref 0.0–0.1)
Immature Granulocytes: 0 %
Lymphocytes Absolute: 1.5 10*3/uL (ref 0.7–3.1)
Lymphs: 18 %
MCH: 25.4 pg — ABNORMAL LOW (ref 26.6–33.0)
MCHC: 32.9 g/dL (ref 31.5–35.7)
MCV: 77 fL — ABNORMAL LOW (ref 79–97)
Monocytes Absolute: 0.4 10*3/uL (ref 0.1–0.9)
Monocytes: 5 %
Neutrophils Absolute: 6.4 10*3/uL (ref 1.4–7.0)
Neutrophils: 75 %
Platelets: 418 10*3/uL (ref 150–450)
RBC: 4.81 x10E6/uL (ref 3.77–5.28)
RDW: 14.9 % (ref 11.7–15.4)
RPR Ser Ql: NONREACTIVE
Rh Factor: POSITIVE
Rubella Antibodies, IGG: 2.67 index (ref >0.99)
WBC: 8.5 10*3/uL (ref 3.4–10.8)

## 2021-02-23 LAB — CYTOLOGY - PAP
Comment: NEGATIVE
Diagnosis: NEGATIVE
High risk HPV: NEGATIVE

## 2021-02-23 LAB — HEMOGLOBIN A1C
Est. average glucose Bld gHb Est-mCnc: 114 mg/dL
Hgb A1c MFr Bld: 5.6 % (ref 4.8–5.6)

## 2021-02-23 LAB — HEPATITIS C ANTIBODY: Hep C Virus Ab: <0.1 s/co ratio (ref 0.0–0.9)

## 2021-02-24 DIAGNOSIS — Z3A12 12 weeks gestation of pregnancy: Secondary | ICD-10-CM | POA: Diagnosis not present

## 2021-02-24 DIAGNOSIS — Z3689 Encounter for other specified antenatal screening: Secondary | ICD-10-CM | POA: Diagnosis not present

## 2021-02-24 LAB — CERVICOVAGINAL ANCILLARY ONLY
Bacterial Vaginitis (gardnerella): POSITIVE — AB
Candida Glabrata: NEGATIVE
Candida Vaginitis: NEGATIVE
Chlamydia: NEGATIVE
Comment: NEGATIVE
Comment: NEGATIVE
Comment: NEGATIVE
Comment: NEGATIVE
Comment: NEGATIVE
Comment: NORMAL
Neisseria Gonorrhea: NEGATIVE
Trichomonas: NEGATIVE

## 2021-02-25 LAB — URINE CULTURE, OB REFLEX

## 2021-02-25 LAB — CULTURE, OB URINE

## 2021-02-26 ENCOUNTER — Telehealth: Payer: Self-pay

## 2021-02-26 NOTE — Telephone Encounter (Signed)
Attempted to call patient to review results of ultrasound. HIPPA compliant message left.   Will send myChart message as well.   Rolm Bookbinder, CNM 02/26/21 9:34 AM

## 2021-02-28 ENCOUNTER — Other Ambulatory Visit: Payer: Self-pay | Admitting: Obstetrics

## 2021-02-28 ENCOUNTER — Encounter: Payer: Self-pay | Admitting: Obstetrics

## 2021-02-28 DIAGNOSIS — B9689 Other specified bacterial agents as the cause of diseases classified elsewhere: Secondary | ICD-10-CM

## 2021-02-28 DIAGNOSIS — N760 Acute vaginitis: Secondary | ICD-10-CM

## 2021-02-28 DIAGNOSIS — O2341 Unspecified infection of urinary tract in pregnancy, first trimester: Secondary | ICD-10-CM

## 2021-02-28 MED ORDER — METRONIDAZOLE 500 MG PO TABS: 500.0000 mg | ORAL_TABLET | Freq: Two times a day (BID) | ORAL | 2 refills | Status: DC

## 2021-02-28 MED ORDER — CEFUROXIME AXETIL 500 MG PO TABS: 500.0000 mg | ORAL_TABLET | Freq: Two times a day (BID) | ORAL | 0 refills | Status: DC

## 2021-03-06 ENCOUNTER — Encounter: Payer: Self-pay | Admitting: Obstetrics

## 2021-03-21 DIAGNOSIS — Z419 Encounter for procedure for purposes other than remedying health state, unspecified: Secondary | ICD-10-CM | POA: Diagnosis not present

## 2021-03-22 ENCOUNTER — Encounter: Payer: Self-pay | Admitting: Obstetrics & Gynecology

## 2021-03-22 ENCOUNTER — Ambulatory Visit (INDEPENDENT_AMBULATORY_CARE_PROVIDER_SITE_OTHER): Payer: Medicaid Other | Admitting: Obstetrics & Gynecology

## 2021-03-22 ENCOUNTER — Other Ambulatory Visit (HOSPITAL_COMMUNITY)
Admission: RE | Admit: 2021-03-22 | Discharge: 2021-03-22 | Disposition: A | Discharge status: Home / Self Care | Payer: Medicaid Other | Source: Ambulatory Visit | Attending: Obstetrics & Gynecology | Admitting: Obstetrics & Gynecology

## 2021-03-22 VITALS — BP 107/68 | HR 69 | Wt 152.0 lb

## 2021-03-22 DIAGNOSIS — O0990 Supervision of high risk pregnancy, unspecified, unspecified trimester: Secondary | ICD-10-CM

## 2021-03-22 DIAGNOSIS — Z8759 Personal history of other complications of pregnancy, childbirth and the puerperium: Secondary | ICD-10-CM

## 2021-03-22 DIAGNOSIS — O09219 Supervision of pregnancy with history of pre-term labor, unspecified trimester: Secondary | ICD-10-CM | POA: Insufficient documentation

## 2021-03-22 DIAGNOSIS — O26892 Other specified pregnancy related conditions, second trimester: Secondary | ICD-10-CM | POA: Diagnosis not present

## 2021-03-22 DIAGNOSIS — N898 Other specified noninflammatory disorders of vagina: Secondary | ICD-10-CM | POA: Diagnosis not present

## 2021-03-22 DIAGNOSIS — Z8279 Family history of other congenital malformations, deformations and chromosomal abnormalities: Secondary | ICD-10-CM

## 2021-03-22 DIAGNOSIS — O99891 Other specified diseases and conditions complicating pregnancy: Secondary | ICD-10-CM | POA: Diagnosis not present

## 2021-03-22 DIAGNOSIS — N760 Acute vaginitis: Secondary | ICD-10-CM

## 2021-03-22 DIAGNOSIS — D563 Thalassemia minor: Secondary | ICD-10-CM

## 2021-03-22 DIAGNOSIS — M549 Dorsalgia, unspecified: Secondary | ICD-10-CM

## 2021-03-22 DIAGNOSIS — B9689 Other specified bacterial agents as the cause of diseases classified elsewhere: Secondary | ICD-10-CM

## 2021-03-22 DIAGNOSIS — Z3A16 16 weeks gestation of pregnancy: Secondary | ICD-10-CM

## 2021-03-22 DIAGNOSIS — Z8632 Personal history of gestational diabetes: Secondary | ICD-10-CM | POA: Insufficient documentation

## 2021-03-22 DIAGNOSIS — O09299 Supervision of pregnancy with other poor reproductive or obstetric history, unspecified trimester: Secondary | ICD-10-CM | POA: Insufficient documentation

## 2021-03-22 HISTORY — DX: Thalassemia minor: D56.3

## 2021-03-22 LAB — POCT URINALYSIS DIPSTICK
Appearance: ABNORMAL
Bilirubin, UA: NEGATIVE
Blood, UA: NEGATIVE
Glucose, UA: NEGATIVE
Ketones, UA: NEGATIVE
Nitrite, UA: NEGATIVE
Protein, UA: NEGATIVE
Spec Grav, UA: 1.01 (ref 1.010–1.025)
Urobilinogen, UA: 0.2 E.U./dL
pH, UA: 7 (ref 5.0–8.0)

## 2021-03-22 NOTE — Patient Instructions (Signed)
Return to office for any scheduled appointments. Call the office or go to the MAU at [facility] if:  You begin to have strong, frequent contractions  Your water breaks.  Sometimes it is a big gush of fluid, sometimes it is just a trickle that keeps getting your panties wet or running down your legs  You have vaginal bleeding.  It is normal to have a small amount of spotting if your cervix was checked.   Any other obstetric concerns.   

## 2021-03-22 NOTE — Addendum Note (Signed)
Addended by: Charlsie Quest B on: 03/22/2021 11:27 AM   Modules accepted: Orders

## 2021-03-22 NOTE — Progress Notes (Signed)
   PRENATAL VISIT NOTE  Subjective:  Andrea [name] is a 30 y.o. [phone] at [redacted]w[redacted]d being seen today for ongoing prenatal care.  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  Patient reports pelvic pressure and back pain. Concerned due to history of preterm deliveries.  Contractions: Irritability. Vag. Bleeding: None.  Movement: Absent. Denies leaking of fluid.   The following portions of the patient's history were reviewed and updated as appropriate: allergies, current medications, past family history, past medical history, past social history, past surgical history and problem list.   Objective:   Vitals:   03/22/21 1037  BP: 107/68  Pulse: 69  Weight: 152 lb (68.9 kg)    Fetal Status: Fetal Heart Rate (bpm): 141   Movement: Absent     General:  Alert, oriented and cooperative. Patient is in no acute distress.  Skin: Skin is warm and dry. No rash noted.   Cardiovascular: Normal heart rate noted  Respiratory: Normal respiratory effort, no problems with respiration noted  Abdomen: Soft, gravid, appropriate for gestational age.  Pain/Pressure: Present     Pelvic: Cervical exam performed in the presence of a chaperone      Closed cervix, palpated about 3 cm length, high. Thin, yellow discharge seen, testing sample obtained.  Extremities: Normal range of motion.  Edema: None  Mental Status: Normal mood and affect. Normal behavior. Normal judgment and thought content.   Assessment and Plan:  Pregnancy: [id number] at [redacted]w[redacted]d 1. Pelvic pressure  and back pain affecting pregnancy in second trimester - POCT Urinalysis Dipstick - [place] MFM OB TRANSVAGINAL; Future ordered for cervical length  2. History of placental abruption Patient reported this history today. - [place] MFM OB DETAIL +14 WK; Future  3. Previous preterm delivery x 3, antepartum Had Makena with previous pregnancy, wants this. Discussed recent FDA decisions about Makena, she still wants to use this.  This will be ordered for patient. In the meantime, will check cervical length ultrasound and manage accordingly. - [place] MFM OB DETAIL +14 WK; Future - [place] MFM OB TRANSVAGINAL; Future  4. Vaginal discharge during pregnancy in second trimester - Cervicovaginal ancillary only( [place]) done, will follow up results and manage accordingly.  5. 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 - [place] MFM OB DETAIL +14 WK; Future  6. Alpha thalassemia silent carrier Informed of result, will get set up for Genetics, FOB testing. - AMB MFM GENETICS REFERRAL  7. [redacted] weeks gestation of pregnancy 8. Supervision of high risk pregnancy, antepartum Low risk NIPS. AFP today. - AFP, Serum, Open Spina Bifida - [place] MFM OB DETAIL +14 WK; Future Preterm labor symptoms and general obstetric precautions including but not limited to vaginal bleeding, contractions, leaking of fluid and fetal movement were reviewed in detail with the patient. Please refer to After Visit Summary for other counseling recommendations.   No follow-ups on file.  Future Appointments  Date Time Provider Department Center  04/12/2021 10:15 AM [facility] NURSE [facility]  04/12/2021 10:30 AM [facility] US3 [facility]  04/12/2021  1:00 PM [facility] GENETIC COUNSELING [facility]    Jaynie Collins, MD

## 2021-03-24 ENCOUNTER — Ambulatory Visit: Payer: Medicaid Other | Attending: Obstetrics and Gynecology

## 2021-03-24 ENCOUNTER — Other Ambulatory Visit: Payer: Self-pay

## 2021-03-24 ENCOUNTER — Other Ambulatory Visit: Payer: Self-pay | Admitting: Obstetrics & Gynecology

## 2021-03-24 ENCOUNTER — Ambulatory Visit: Payer: Medicaid Other | Admitting: *Deleted

## 2021-03-24 ENCOUNTER — Encounter: Payer: Self-pay | Admitting: *Deleted

## 2021-03-24 VITALS — BP 115/76 | HR 82

## 2021-03-24 DIAGNOSIS — Z3A16 16 weeks gestation of pregnancy: Secondary | ICD-10-CM | POA: Diagnosis not present

## 2021-03-24 DIAGNOSIS — O09292 Supervision of pregnancy with other poor reproductive or obstetric history, second trimester: Secondary | ICD-10-CM

## 2021-03-24 DIAGNOSIS — O99891 Other specified diseases and conditions complicating pregnancy: Secondary | ICD-10-CM

## 2021-03-24 DIAGNOSIS — D563 Thalassemia minor: Secondary | ICD-10-CM | POA: Diagnosis not present

## 2021-03-24 DIAGNOSIS — O09219 Supervision of pregnancy with history of pre-term labor, unspecified trimester: Secondary | ICD-10-CM | POA: Diagnosis not present

## 2021-03-24 DIAGNOSIS — O09299 Supervision of pregnancy with other poor reproductive or obstetric history, unspecified trimester: Secondary | ICD-10-CM | POA: Diagnosis not present

## 2021-03-24 DIAGNOSIS — M549 Dorsalgia, unspecified: Secondary | ICD-10-CM | POA: Insufficient documentation

## 2021-03-24 DIAGNOSIS — O09212 Supervision of pregnancy with history of pre-term labor, second trimester: Secondary | ICD-10-CM

## 2021-03-24 DIAGNOSIS — Z8759 Personal history of other complications of pregnancy, childbirth and the puerperium: Secondary | ICD-10-CM

## 2021-03-24 DIAGNOSIS — Z8632 Personal history of gestational diabetes: Secondary | ICD-10-CM | POA: Diagnosis not present

## 2021-03-24 LAB — AFP, SERUM, OPEN SPINA BIFIDA
AFP MoM: 0.47
AFP Value: 16.2 ng/mL
Gest. Age on Collection Date: 16 weeks
Maternal Age At EDD: 29.7 yr
OSBR Risk 1 IN: 10000
Test Results:: NEGATIVE
Weight: 152 [lb_av]

## 2021-03-24 LAB — CERVICOVAGINAL ANCILLARY ONLY
Bacterial Vaginitis (gardnerella): POSITIVE — AB
Candida Glabrata: NEGATIVE
Candida Vaginitis: NEGATIVE
Chlamydia: NEGATIVE
Comment: NEGATIVE
Comment: NEGATIVE
Comment: NEGATIVE
Comment: NEGATIVE
Comment: NEGATIVE
Comment: NORMAL
Neisseria Gonorrhea: NEGATIVE
Trichomonas: NEGATIVE

## 2021-03-24 LAB — URINE CULTURE, OB REFLEX

## 2021-03-24 LAB — CULTURE, OB URINE

## 2021-03-27 MED ORDER — METRONIDAZOLE 500 MG PO TABS: 500.0000 mg | ORAL_TABLET | Freq: Two times a day (BID) | ORAL | 0 refills | Status: AC

## 2021-03-27 NOTE — Addendum Note (Signed)
Addended by: Jaynie Collins A on: 03/27/2021 12:12 PM   Modules accepted: Orders

## 2021-04-11 DIAGNOSIS — Z3A19 19 weeks gestation of pregnancy: Secondary | ICD-10-CM | POA: Diagnosis not present

## 2021-04-11 DIAGNOSIS — O26892 Other specified pregnancy related conditions, second trimester: Secondary | ICD-10-CM | POA: Diagnosis not present

## 2021-04-11 DIAGNOSIS — O2312 Infections of bladder in pregnancy, second trimester: Secondary | ICD-10-CM | POA: Diagnosis not present

## 2021-04-11 DIAGNOSIS — Z3A18 18 weeks gestation of pregnancy: Secondary | ICD-10-CM | POA: Diagnosis not present

## 2021-04-12 ENCOUNTER — Other Ambulatory Visit: Payer: Self-pay

## 2021-04-12 ENCOUNTER — Ambulatory Visit (HOSPITAL_BASED_OUTPATIENT_CLINIC_OR_DEPARTMENT_OTHER): Payer: Medicaid Other

## 2021-04-12 ENCOUNTER — Other Ambulatory Visit: Payer: Self-pay | Admitting: *Deleted

## 2021-04-12 ENCOUNTER — Telehealth: Payer: Self-pay | Admitting: *Deleted

## 2021-04-12 ENCOUNTER — Encounter: Payer: Self-pay | Admitting: *Deleted

## 2021-04-12 ENCOUNTER — Ambulatory Visit: Payer: Medicaid Other | Attending: Obstetrics

## 2021-04-12 ENCOUNTER — Ambulatory Visit: Payer: Medicaid Other | Admitting: *Deleted

## 2021-04-12 VITALS — BP 113/64 | HR 92

## 2021-04-12 DIAGNOSIS — Z8279 Family history of other congenital malformations, deformations and chromosomal abnormalities: Secondary | ICD-10-CM | POA: Diagnosis not present

## 2021-04-12 DIAGNOSIS — D563 Thalassemia minor: Secondary | ICD-10-CM

## 2021-04-12 DIAGNOSIS — Z8632 Personal history of gestational diabetes: Secondary | ICD-10-CM | POA: Insufficient documentation

## 2021-04-12 DIAGNOSIS — O09213 Supervision of pregnancy with history of pre-term labor, third trimester: Secondary | ICD-10-CM | POA: Diagnosis not present

## 2021-04-12 DIAGNOSIS — Z3689 Encounter for other specified antenatal screening: Secondary | ICD-10-CM

## 2021-04-12 DIAGNOSIS — O09219 Supervision of pregnancy with history of pre-term labor, unspecified trimester: Secondary | ICD-10-CM | POA: Diagnosis not present

## 2021-04-12 DIAGNOSIS — O09299 Supervision of pregnancy with other poor reproductive or obstetric history, unspecified trimester: Secondary | ICD-10-CM

## 2021-04-12 DIAGNOSIS — Z8759 Personal history of other complications of pregnancy, childbirth and the puerperium: Secondary | ICD-10-CM | POA: Diagnosis not present

## 2021-04-12 DIAGNOSIS — O09892 Supervision of other high risk pregnancies, second trimester: Secondary | ICD-10-CM

## 2021-04-12 DIAGNOSIS — Z3A19 19 weeks gestation of pregnancy: Secondary | ICD-10-CM

## 2021-04-12 DIAGNOSIS — O0990 Supervision of high risk pregnancy, unspecified, unspecified trimester: Secondary | ICD-10-CM | POA: Insufficient documentation

## 2021-04-12 DIAGNOSIS — O09292 Supervision of pregnancy with other poor reproductive or obstetric history, second trimester: Secondary | ICD-10-CM

## 2021-04-12 NOTE — Progress Notes (Addendum)
Name: Andrea [name] Indication: Silent carrier for alpha thalassemia Previous child with gastroschisis  DOB: 1991/02/24 Age: 30 y.o.   EDC: 09/06/2021 LMP: 11/30/2020 Referring Provider:  Catalina Antigua, MD  EGA: [redacted]w[redacted]d Genetic Counselor: Andrea Dunk, MS, CGC  OB Hx: [id number] Date of Appointment: 04/12/2021  Accompanied by: [name] Face to Face Time: 30 Minutes   Previous Testing Completed: Andrea [name] previously completed Non-Invasive Prenatal Screening (NIPS) in this pregnancy (scanned into Epic under the Media tab). The result is low risk, consistent with a female fetus. This screening significantly reduces the risk that the current pregnancy has Down syndrome, Trisomy 7, Trisomy 13, Monosomy X, and Triploidy, however, the risk is not zero given the limitations of NIPS. Additionally, there are many genetic conditions that cannot be detected by NIPS.  Andrea [name] previously completed carrier screening (scanned into Epic under the Media tab). She screened to be a silent carrier for alpha thalassemia (see genetic counseling portion of note below). She screened to not be a carrier for Cystic Fibrosis (CF), Spinal Muscular Atrophy (SMA), and beta hemoglobinopathies . A negative result on carrier screening reduces the likelihood of being a carrier, however, does not entirely rule out the possibility. Andrea [name] previously completed a maternal serum AFP screen in this pregnancy. The result is screen negative. A negative result reduces the risk that the current pregnancy has an open neural tube defect.   Medical History:  This is Andrea [name]'s 6th pregnancy. She has 3 living children. She has had 2 elective losses. Andrea [name]'s obstetrical history is significant for pre-eclampsia, gestational diabetes, and preterm deliveries.  Andrea [name] denies a personal history of diabetes and high blood pressure outside of pregnancy. She denies having thyroid issues and seizures.  Reports she stopped drinking alcohol when she  learned she was pregnant. Denies using tobacco and street drugs in this pregnancy. Denies bleeding, infections, and fevers in this pregnancy.   Family History: A pedigree was created and scanned into Epic under the Media tab. Andrea [name]'s son from a previous partner was born with gastroschisis. Andrea [name] reports that he is otherwise healthy and doing well.  Maternal ethnicity reported as Location manager and paternal ethnicity reported as Scientist, forensic and [place]. Denies Ashkenazi Jewish ancestry. Family history not remarkable for consanguinity, intellectual disability, autism spectrum disorder, mental illness, multiple spontaneous abortions, still births, or unexplained neonatal death.     Genetic Counseling:   Silent Carrier for Alpha Thalassemia. Andrea [name] is a silent carrier for alpha thalassemia (??/?-). Positive for the pathogenic alpha 3.7 deletion of the HBA2 gene. With Andrea [name]'s alpha thalassemia screening result, we know that she has three working copies of the alpha-globin genes while the 4th alpha-globin gene is deleted. Each of Andrea [name]'s children will either inherit two functional copies or one functional copy with one deletion from her. Andrea [name] is not at an increased risk to have a baby with fetal hydrops due to Hemoglobin Barts disease (--/--) regardless of her partner's carrier status. We discussed there would be a 25% risk for the current pregnancy to be affected with Hemoglobin H disease (--/?-) if her partner is found to be an alpha thalassemia carrier in the cis configuration (??/--). Clinical features of Hemoglobin H disease are highly variable and generally develop in the first years of life. The primary symptoms include moderate anemia with marked microcytosis, jaundice, and hepatosplenomegaly. Some affected individuals do not require blood transfusions while others may require occasional blood transfusions throughout their lifetime. Because Andrea [name] is a  silent carrier for alpha thalassemia (??/?-), carrier  screening for her reproductive partner is recommended to determine risk for the current pregnancy. If Andrea [name] partner is a non-carrier (??/??), a silent carrier (??/?-) or a carrier in the trans configuration (?-/?-) there would not be an increased risk for the pregnancy to have Hemoglobin H disease.  Andrea [name] appeared to understand the information above and declined carrier screening on her partner's behalf. Genetic counseling reviewed with Andrea [name] that this screening remains available to her partner in the future.  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 Gastroschisis is an abdominal wall defect characterized by the evisceration of the abdominal organs through a small opening, usually present to the right of the umbilicus. Gastroschisis always includes the small intestine, but could also involve the stomach, colon and gonad. The exposed organs found outside the body cavity lack a protective membranous covering. The prevalence of gastroschisis appears to be higher with younger maternal age. The etiology of gastroschisis is generally thought to be related more to environmental factors than genetic factors. Isolated gastroschisis is most commonly a sporadic event and is not associated with an increased risk for aneuploidy or genetic syndromes. Most cases of gastroschisis are considered isolated events within a family with a very low risk for recurrence for the affected individual's first-degree relatives.    Patient Plan:  Proceed with: Routine prenatal care Informed consent was obtained. All questions were answered.  Declined: Carrier screening for her reproductive partner for alpha thalassemia   Thank you for sharing in the care of Andrea [name] with [place].  Please do not hesitate to contact us if you have any questions.  Andrea Dunk, MS, [facility]

## 2021-04-12 NOTE — Telephone Encounter (Signed)
Pt called to office asking about Makena injection.  Makena is in office and pt made aware will receive at next visit.  Pt also has congestion and cough.  Made aware of OTC meds to take.  Advised may be seen at hospital if symptoms worsen due to holiday weekend.   Pt states understanding.

## 2021-04-19 ENCOUNTER — Encounter: Payer: Self-pay | Admitting: Obstetrics and Gynecology

## 2021-04-19 ENCOUNTER — Other Ambulatory Visit: Payer: Self-pay

## 2021-04-19 ENCOUNTER — Ambulatory Visit (INDEPENDENT_AMBULATORY_CARE_PROVIDER_SITE_OTHER): Payer: Medicaid Other | Admitting: Obstetrics and Gynecology

## 2021-04-19 VITALS — BP 113/72 | HR 93 | Wt 154.0 lb

## 2021-04-19 DIAGNOSIS — Z3A20 20 weeks gestation of pregnancy: Secondary | ICD-10-CM

## 2021-04-19 DIAGNOSIS — O0990 Supervision of high risk pregnancy, unspecified, unspecified trimester: Secondary | ICD-10-CM

## 2021-04-19 DIAGNOSIS — O09212 Supervision of pregnancy with history of pre-term labor, second trimester: Secondary | ICD-10-CM | POA: Diagnosis not present

## 2021-04-19 DIAGNOSIS — O09219 Supervision of pregnancy with history of pre-term labor, unspecified trimester: Secondary | ICD-10-CM

## 2021-04-19 DIAGNOSIS — O09299 Supervision of pregnancy with other poor reproductive or obstetric history, unspecified trimester: Secondary | ICD-10-CM

## 2021-04-19 DIAGNOSIS — O0992 Supervision of high risk pregnancy, unspecified, second trimester: Secondary | ICD-10-CM

## 2021-04-19 DIAGNOSIS — O09292 Supervision of pregnancy with other poor reproductive or obstetric history, second trimester: Secondary | ICD-10-CM

## 2021-04-19 DIAGNOSIS — Z8759 Personal history of other complications of pregnancy, childbirth and the puerperium: Secondary | ICD-10-CM

## 2021-04-19 DIAGNOSIS — Z8279 Family history of other congenital malformations, deformations and chromosomal abnormalities: Secondary | ICD-10-CM

## 2021-04-19 DIAGNOSIS — D563 Thalassemia minor: Secondary | ICD-10-CM

## 2021-04-19 DIAGNOSIS — Z8632 Personal history of gestational diabetes: Secondary | ICD-10-CM

## 2021-04-19 MED ORDER — BENZONATATE 100 MG PO CAPS: 100.0000 mg | ORAL_CAPSULE | Freq: Three times a day (TID) | ORAL | 1 refills | Status: DC | PRN

## 2021-04-19 MED ORDER — HYDROXYPROGESTERONE CAPROATE 275 MG/1.1ML ~~LOC~~ SOAJ
275.0000 mg | Freq: Once | SUBCUTANEOUS | Status: AC
  Administered 2021-04-19: 275 mg via SUBCUTANEOUS

## 2021-04-19 NOTE — Patient Instructions (Signed)

## 2021-04-19 NOTE — Progress Notes (Signed)
Pt states she had some spotting over weekend, was seen at [facility] ED and was fine. Pt denies any bleeding since.

## 2021-04-19 NOTE — Progress Notes (Signed)
Subjective:  Andrea [name] is a 30 y.o. [id number] at [redacted]w[redacted]d being seen today for ongoing prenatal care.  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  Patient reports post viral cough.  Contractions: Not present. Vag. Bleeding: None.  Movement: Absent. Denies leaking of fluid.   The following portions of the patient's history were reviewed and updated as appropriate: allergies, current medications, past family history, past medical history, past social history, past surgical history and problem list. Problem list updated.  Objective:   Vitals:   04/19/21 1101  BP: 113/72  Pulse: 93  Weight: 154 lb (69.9 kg)    Fetal Status:     Movement: Absent     General:  Alert, oriented and cooperative. Patient is in no acute distress.  Skin: Skin is warm and dry. No rash noted.   Cardiovascular: Normal heart rate noted  Respiratory: Normal respiratory effort, no problems with respiration noted  Abdomen: Soft, gravid, appropriate for gestational age. Pain/Pressure: Absent     Pelvic:  Cervical exam deferred        Extremities: Normal range of motion.     Mental Status: Normal mood and affect. Normal behavior. Normal judgment and thought content.   Urinalysis:      Assessment and Plan:  Pregnancy: [id number] at [redacted]w[redacted]d  1. Supervision of high risk pregnancy, antepartum Stable  2. Previous preterm delivery x 3, antepartum Starting Makena today CL normal on last U/S Q 2 week CL as per MFM  3. History of placental abruption Stable  4. Alpha  thalassemia silent carrier S/P genetic counseling  5. History of gestational diabetes mellitus (GDM) in prior pregnancy, currently pregnant Stable  6. History of preeclampsia, prior pregnancy, currently pregnant Continue with qd BASA  7. 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 Please refer to After Visit Summary for other counseling recommendations.  Return in about 4 weeks (around 05/17/2021) for OB visit, face to face, MD only.   Hermina Staggers, MD

## 2021-04-20 DIAGNOSIS — Z419 Encounter for procedure for purposes other than remedying health state, unspecified: Secondary | ICD-10-CM | POA: Diagnosis not present

## 2021-04-26 ENCOUNTER — Ambulatory Visit (INDEPENDENT_AMBULATORY_CARE_PROVIDER_SITE_OTHER): Payer: Medicaid Other

## 2021-04-26 ENCOUNTER — Other Ambulatory Visit: Payer: Self-pay

## 2021-04-26 ENCOUNTER — Ambulatory Visit: Payer: Medicaid Other

## 2021-04-26 DIAGNOSIS — O09219 Supervision of pregnancy with history of pre-term labor, unspecified trimester: Secondary | ICD-10-CM

## 2021-04-26 DIAGNOSIS — O0990 Supervision of high risk pregnancy, unspecified, unspecified trimester: Secondary | ICD-10-CM

## 2021-04-26 DIAGNOSIS — O0992 Supervision of high risk pregnancy, unspecified, second trimester: Secondary | ICD-10-CM

## 2021-04-26 DIAGNOSIS — Z3A21 21 weeks gestation of pregnancy: Secondary | ICD-10-CM

## 2021-04-26 DIAGNOSIS — O09212 Supervision of pregnancy with history of pre-term labor, second trimester: Secondary | ICD-10-CM | POA: Diagnosis not present

## 2021-04-26 MED ORDER — HYDROXYPROGESTERONE CAPROATE 275 MG/1.1ML ~~LOC~~ SOAJ
275.0000 mg | Freq: Once | SUBCUTANEOUS | Status: AC
  Administered 2021-04-26: 275 mg via SUBCUTANEOUS

## 2021-04-26 NOTE — Progress Notes (Signed)
Pt supply Makena given L arm without difficulty. Next injection due next week; already scheduled.

## 2021-04-27 ENCOUNTER — Ambulatory Visit (HOSPITAL_BASED_OUTPATIENT_CLINIC_OR_DEPARTMENT_OTHER): Payer: Medicaid Other

## 2021-04-27 ENCOUNTER — Ambulatory Visit: Payer: Medicaid Other | Attending: Maternal & Fetal Medicine | Admitting: *Deleted

## 2021-04-27 VITALS — BP 107/70 | HR 72

## 2021-04-27 DIAGNOSIS — D563 Thalassemia minor: Secondary | ICD-10-CM

## 2021-04-27 DIAGNOSIS — O09292 Supervision of pregnancy with other poor reproductive or obstetric history, second trimester: Secondary | ICD-10-CM

## 2021-04-27 DIAGNOSIS — Z3A21 21 weeks gestation of pregnancy: Secondary | ICD-10-CM | POA: Insufficient documentation

## 2021-04-27 DIAGNOSIS — O09299 Supervision of pregnancy with other poor reproductive or obstetric history, unspecified trimester: Secondary | ICD-10-CM

## 2021-04-27 DIAGNOSIS — Z8632 Personal history of gestational diabetes: Secondary | ICD-10-CM

## 2021-04-27 DIAGNOSIS — O09892 Supervision of other high risk pregnancies, second trimester: Secondary | ICD-10-CM | POA: Diagnosis not present

## 2021-04-27 DIAGNOSIS — Z8759 Personal history of other complications of pregnancy, childbirth and the puerperium: Secondary | ICD-10-CM

## 2021-05-02 ENCOUNTER — Ambulatory Visit: Payer: Self-pay

## 2021-05-03 ENCOUNTER — Ambulatory Visit (INDEPENDENT_AMBULATORY_CARE_PROVIDER_SITE_OTHER): Payer: Medicaid Other | Admitting: *Deleted

## 2021-05-03 ENCOUNTER — Other Ambulatory Visit: Payer: Self-pay

## 2021-05-03 VITALS — BP 115/72 | HR 83

## 2021-05-03 DIAGNOSIS — O09299 Supervision of pregnancy with other poor reproductive or obstetric history, unspecified trimester: Secondary | ICD-10-CM

## 2021-05-03 DIAGNOSIS — D563 Thalassemia minor: Secondary | ICD-10-CM

## 2021-05-03 DIAGNOSIS — Z3A22 22 weeks gestation of pregnancy: Secondary | ICD-10-CM | POA: Diagnosis not present

## 2021-05-03 DIAGNOSIS — Z8751 Personal history of pre-term labor: Secondary | ICD-10-CM

## 2021-05-03 DIAGNOSIS — Z8632 Personal history of gestational diabetes: Secondary | ICD-10-CM

## 2021-05-03 DIAGNOSIS — Z8759 Personal history of other complications of pregnancy, childbirth and the puerperium: Secondary | ICD-10-CM

## 2021-05-03 MED ORDER — HYDROXYPROGESTERONE CAPROATE 275 MG/1.1ML ~~LOC~~ SOAJ
275.0000 mg | Freq: Once | SUBCUTANEOUS | Status: AC
  Administered 2021-05-03: 10:00:00 275 mg via SUBCUTANEOUS

## 2021-05-03 NOTE — Progress Notes (Signed)
Pt is in office for 17p injection.  Pt tolerated injection in Left arm. Pt has no other concerns today.  Advised to f/u next week for appt.  BP 115/72    Pulse 83    LMP 11/30/2020   Administrations This Visit     HYDROXYprogesterone caproate (Makena) autoinjector 275 mg     Admin Date 05/03/2021 Action Given Dose 275 mg Route Subcutaneous Administered By Lanney Gins, CMA

## 2021-05-03 NOTE — Progress Notes (Signed)
Agree with nurses's documentation of this patient's clinic encounter.  Iyanah Demont L, MD  

## 2021-05-10 ENCOUNTER — Other Ambulatory Visit: Payer: Self-pay

## 2021-05-10 ENCOUNTER — Ambulatory Visit (INDEPENDENT_AMBULATORY_CARE_PROVIDER_SITE_OTHER): Payer: Medicaid Other

## 2021-05-10 VITALS — BP 97/63 | HR 82 | Ht 61.0 in | Wt 148.0 lb

## 2021-05-10 DIAGNOSIS — O09219 Supervision of pregnancy with history of pre-term labor, unspecified trimester: Secondary | ICD-10-CM | POA: Diagnosis not present

## 2021-05-10 DIAGNOSIS — O219 Vomiting of pregnancy, unspecified: Secondary | ICD-10-CM

## 2021-05-10 MED ORDER — HYDROXYPROGESTERONE CAPROATE 275 MG/1.1ML ~~LOC~~ SOAJ
275.0000 mg | Freq: Once | SUBCUTANEOUS | Status: AC
  Administered 2021-05-10: 11:00:00 275 mg via SUBCUTANEOUS

## 2021-05-10 MED ORDER — ONDANSETRON 4 MG PO TBDP
4.0000 mg | ORAL_TABLET | Freq: Four times a day (QID) | ORAL | 0 refills | Status: DC | PRN
Start: 2021-05-10 — End: 2021-08-01

## 2021-05-10 NOTE — Progress Notes (Signed)
17P given IM Right arm. Pt tolerated well with no adverse side effects noted. Pt to return to clinic in 1 week for HROB appointment and repeat injection. Pt does complain of some nausea and vomiting today and request Rx for Zofran. Will send in Rx per protocol.

## 2021-05-12 ENCOUNTER — Encounter: Payer: Self-pay | Admitting: *Deleted

## 2021-05-12 ENCOUNTER — Ambulatory Visit: Payer: Medicaid Other | Attending: Obstetrics and Gynecology

## 2021-05-12 ENCOUNTER — Ambulatory Visit: Payer: Medicaid Other | Admitting: *Deleted

## 2021-05-12 ENCOUNTER — Other Ambulatory Visit: Payer: Self-pay

## 2021-05-12 ENCOUNTER — Other Ambulatory Visit: Payer: Self-pay | Admitting: *Deleted

## 2021-05-12 VITALS — BP 122/78 | HR 83

## 2021-05-12 DIAGNOSIS — Z8632 Personal history of gestational diabetes: Secondary | ICD-10-CM | POA: Diagnosis not present

## 2021-05-12 DIAGNOSIS — Z3A23 23 weeks gestation of pregnancy: Secondary | ICD-10-CM

## 2021-05-12 DIAGNOSIS — O09299 Supervision of pregnancy with other poor reproductive or obstetric history, unspecified trimester: Secondary | ICD-10-CM | POA: Insufficient documentation

## 2021-05-12 DIAGNOSIS — O09899 Supervision of other high risk pregnancies, unspecified trimester: Secondary | ICD-10-CM

## 2021-05-12 DIAGNOSIS — D563 Thalassemia minor: Secondary | ICD-10-CM | POA: Diagnosis not present

## 2021-05-12 DIAGNOSIS — O09292 Supervision of pregnancy with other poor reproductive or obstetric history, second trimester: Secondary | ICD-10-CM

## 2021-05-12 DIAGNOSIS — Z8759 Personal history of other complications of pregnancy, childbirth and the puerperium: Secondary | ICD-10-CM | POA: Insufficient documentation

## 2021-05-12 DIAGNOSIS — O09892 Supervision of other high risk pregnancies, second trimester: Secondary | ICD-10-CM | POA: Insufficient documentation

## 2021-05-12 DIAGNOSIS — Z3689 Encounter for other specified antenatal screening: Secondary | ICD-10-CM

## 2021-05-14 NOTE — Progress Notes (Deleted)
° °  PRENATAL VISIT NOTE  Subjective:  Andrea [name] is a 30 y.o. [phone] at [redacted]w[redacted]d being seen today for ongoing prenatal care.  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  Patient reports {[id number]}.   .  .   . Denies leaking of fluid.   The following portions of the patient's history were reviewed and updated as appropriate: allergies, current medications, past family history, past medical history, past social history, past surgical history and problem list.   Objective:  There were no vitals filed for this visit.  Fetal Status:           General:  Alert, oriented and cooperative. Patient is in no acute distress.  Skin: Skin is warm and dry. No rash noted.   Cardiovascular: Normal heart rate noted  Respiratory: Normal respiratory effort, no problems with respiration noted  Abdomen: Soft, gravid, appropriate for gestational age.        Pelvic: Cervical exam deferred        Extremities: Normal range of motion.     Mental Status: Normal mood and affect. Normal behavior. Normal judgment and thought content.   Assessment and Plan:  Pregnancy: [id number] at [redacted]w[redacted]d 1. Supervision of high risk pregnancy, antepartum Normal growth and and completed anatomy Discussed 28w labs next time and Tdap. Reviewed fasting lab.   2. History of preeclampsia, prior pregnancy, currently pregnant   3. History of  gestational diabetes mellitus (GDM) in prior pregnancy, currently pregnant Normal early A1C  4. Previous preterm delivery x 3, antepartum Doing Makena, CL normal on 12/23 of 3.37cm.   5. Alpha thalassemia silent carrier Discussed genetic counseling and FOB testing - pt ***  Preterm labor symptoms and general obstetric precautions including but not limited to vaginal bleeding, contractions, leaking of fluid and fetal movement were reviewed in detail with the patient. Please refer to After Visit Summary for other counseling recommendations.   No follow-ups on file.  Future Appointments  Date Time Provider Department Center  05/16/2021 10:55 AM Milas Hock, MD [facility] None  07/13/2021 10:45 AM [facility] NURSE [facility]  07/13/2021 11:00 AM [facility] US1 [facility]    Milas Hock, MD

## 2021-05-15 DIAGNOSIS — O0990 Supervision of high risk pregnancy, unspecified, unspecified trimester: Secondary | ICD-10-CM | POA: Diagnosis not present

## 2021-05-16 ENCOUNTER — Ambulatory Visit (INDEPENDENT_AMBULATORY_CARE_PROVIDER_SITE_OTHER): Payer: Medicaid Other | Admitting: Family Medicine

## 2021-05-16 ENCOUNTER — Other Ambulatory Visit: Payer: Self-pay

## 2021-05-16 ENCOUNTER — Encounter: Payer: Medicaid Other | Admitting: Obstetrics and Gynecology

## 2021-05-16 VITALS — BP 103/66 | HR 77 | Wt 151.0 lb

## 2021-05-16 DIAGNOSIS — O09219 Supervision of pregnancy with history of pre-term labor, unspecified trimester: Secondary | ICD-10-CM

## 2021-05-16 DIAGNOSIS — Z8751 Personal history of pre-term labor: Secondary | ICD-10-CM

## 2021-05-16 DIAGNOSIS — Z8632 Personal history of gestational diabetes: Secondary | ICD-10-CM

## 2021-05-16 DIAGNOSIS — O09299 Supervision of pregnancy with other poor reproductive or obstetric history, unspecified trimester: Secondary | ICD-10-CM

## 2021-05-16 DIAGNOSIS — Z3A24 24 weeks gestation of pregnancy: Secondary | ICD-10-CM | POA: Diagnosis not present

## 2021-05-16 DIAGNOSIS — O0990 Supervision of high risk pregnancy, unspecified, unspecified trimester: Secondary | ICD-10-CM

## 2021-05-16 MED ORDER — HYDROXYPROGESTERONE CAPROATE 275 MG/1.1ML ~~LOC~~ SOAJ
275.0000 mg | Freq: Once | SUBCUTANEOUS | Status: AC
  Administered 2021-05-16: 15:00:00 275 mg via SUBCUTANEOUS

## 2021-05-16 NOTE — Patient Instructions (Signed)

## 2021-05-16 NOTE — Progress Notes (Signed)
° °  PRENATAL VISIT NOTE  Subjective:  Andrea [name] is a 30 y.o. [phone] at [redacted]w[redacted]d being seen today for ongoing prenatal care.  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  Patient reports no complaints.  Contractions: Not present. Vag. Bleeding: None.  Movement: Present. Denies leaking of fluid.   The following portions of the patient's history were reviewed and updated as appropriate: allergies, current medications, past family history, past medical history, past social history, past surgical history and problem list.   Objective:   Vitals:   05/16/21 1418  BP: 103/66  Pulse: 77  Weight: 151 lb (68.5 kg)    Fetal Status: Fetal Heart Rate (bpm): 146   Movement: Present     General:  Alert, oriented and cooperative. Patient is in no acute distress.  Skin: Skin is warm and dry. No rash noted.   Cardiovascular: Normal heart rate noted  Respiratory: Normal respiratory effort, no problems with respiration noted  Abdomen: Soft, gravid, appropriate for gestational age.  Pain/Pressure: Present     Pelvic: Cervical exam deferred        Extremities: Normal range of motion.  Edema: None  Mental Status: Normal mood and affect. Normal behavior. Normal judgment and thought content.   Assessment and Plan:  Pregnancy: [id number] at [redacted]w[redacted]d 1. Supervision of high risk pregnancy, antepartum Continue  prenatal care.  2. History of gestational diabetes mellitus (GDM) in prior pregnancy, currently pregnant [redacted] wk labs next visit  3. Previous preterm delivery x 3, antepartum On 17 P - HYDROXYprogesterone caproate (Makena) autoinjector 275 mg  Preterm labor symptoms and general obstetric precautions including but not limited to vaginal bleeding, contractions, leaking of fluid and fetal movement were reviewed in detail with the patient. Please refer to After Visit Summary for other counseling recommendations.   Return in 4 weeks (on 06/13/2021) for 17 P weekly, 28 wk labs, HRC.  Future Appointments  Date Time Provider Department Center  05/24/2021  2:00 PM [facility] NURSE [facility] None  05/31/2021 10:00 AM [facility] NURSE [facility] None  06/07/2021 10:20 AM [facility] NURSE [facility] None  06/12/2021  9:00 AM [facility] LAB [facility] None  06/12/2021  9:55 AM Leftwich-Kirby, Wilmer Floor, CNM [facility] None  07/13/2021 10:45 AM [facility] NURSE [facility]  07/13/2021 11:00 AM [facility] US1 [facility]    Reva Bores, MD

## 2021-05-16 NOTE — Progress Notes (Addendum)
ROB, c/o back pain 8-10/10 x 2+ months. 17P given in RA, tolerated well.  Administrations This Visit     HYDROXYprogesterone caproate (Makena) autoinjector 275 mg     Admin Date 05/16/2021 Action Given Dose 275 mg Route Subcutaneous Administered By Maretta Bees, RMA

## 2021-05-17 ENCOUNTER — Encounter: Payer: Medicaid Other | Admitting: Obstetrics and Gynecology

## 2021-05-21 DIAGNOSIS — Z419 Encounter for procedure for purposes other than remedying health state, unspecified: Secondary | ICD-10-CM | POA: Diagnosis not present

## 2021-05-21 NOTE — L&D Delivery Note (Addendum)
Vaginal Delivery Note ? ?Pre-procedure Diagnosis: SIUP @ [redacted]w[redacted]d ?Indications: 31 y.o. [id number] Estimated Date of Delivery: 09/06/21 here today for mIOL. Her pregnancy has been complicated by A2GDM, and OBhx of PTD x3 . Her labor course was uncomplicated.   ?Post-procedure Diagnosis: SIUP @ [redacted]w[redacted]d ; same ? ?Provider: Ardyth Harps, SNM; Wynelle Bourgeois CNM ? ?Anesthesia: epidural ? ?Complications: none ? ?Delivery Estimated Blood Loss (EBL): 40  mL ? ?Transfusions: none ? ?Pathology: sent to pathology ? ?Labor Events: ?Rupture date: 08/16/2021 , at 4:33 PM .  ?Rupture type: Artificial [2];Intact [6]  ?Fluid characteristic: Clear [1]  ?Interval from ROM to Delivery: 6h 81m ?Induction: AROM [2];Pitocin [5]  ?Augmentation: None [1]  ?Sex: F ? ?Delivery Information for baby girl of Batina Dougan ?Time of Birth: 11:22 PM  ?Baby Weight:  pending ? ?APGARS One minute Five minutes ?Totals: 9  9   ?Newborn is AGA and well. Patient plans to breastfeed.   ? ?Procedure:  ? The patient was in semi fowlers position, draped in a routine fashion.   ?SVD of a viable female infant in cephalic presentation delivered spontaneously over an intact supported perineum. No nuchal cord. No dystocia. No meconium present. Nose and mouth suctioned with bulb; cord clamped and cut. Inspection revealed a short cord, ~ 9in length.The placenta was then delivered spontaneously and intact via Tomasa Blase; [facility]. The uterine fundus was firm after uterine massage and with a 300 ml bolus of 30 units of pitocin. Inspection revealed  no lacerations . Instrument, sponge, and needle counts were correct at the end of the procedure. ? ? ?Disposition: stable to transfer to [facility] ? ? ?Electronically Signed By: ?Ardyth Harps, SNM ?08/16/21 11:51 PM   ? ? ?Attestation: ? ?I confirm that I have verified the information documented in the  SNM ?s note and that I have also personally reperformed the physical exam and all medical decision making activities.  ? ?I was gloved and present  for entire delivery ?SVD without incident ?No difficulty with shoulders ?No lacerations  ?Aviva Signs, CNM ? ? ?

## 2021-05-24 ENCOUNTER — Ambulatory Visit: Payer: Medicaid Other

## 2021-05-24 NOTE — Progress Notes (Signed)
Patient was assessed and managed by nursing staff during this encounter. I have reviewed the chart and agree with the documentation and plan. I have also made any necessary editorial changes.  Scheryl Darter, MD 05/24/2021 10:52 AM

## 2021-05-26 ENCOUNTER — Other Ambulatory Visit: Payer: Self-pay

## 2021-05-26 ENCOUNTER — Ambulatory Visit (INDEPENDENT_AMBULATORY_CARE_PROVIDER_SITE_OTHER): Payer: Medicaid Other

## 2021-05-26 DIAGNOSIS — O09212 Supervision of pregnancy with history of pre-term labor, second trimester: Secondary | ICD-10-CM

## 2021-05-26 DIAGNOSIS — Z3A25 25 weeks gestation of pregnancy: Secondary | ICD-10-CM | POA: Diagnosis not present

## 2021-05-26 DIAGNOSIS — O09219 Supervision of pregnancy with history of pre-term labor, unspecified trimester: Secondary | ICD-10-CM

## 2021-05-26 MED ORDER — HYDROXYPROGESTERONE CAPROATE 275 MG/1.1ML ~~LOC~~ SOAJ
275.0000 mg | Freq: Once | SUBCUTANEOUS | Status: AC
  Administered 2021-05-26: 275 mg via SUBCUTANEOUS

## 2021-05-26 MED ORDER — HYDROXYPROGESTERONE CAPROATE 250 MG/ML IM OIL: 250.0000 mg | TOPICAL_OIL | Freq: Once | INTRAMUSCULAR | 0 refills | Status: DC

## 2021-05-26 MED ORDER — MAKENA 275 MG/1.1ML ~~LOC~~ SOAJ: 275.0000 mg | SUBCUTANEOUS | 4 refills | Status: DC

## 2021-05-26 NOTE — Addendum Note (Signed)
Addended by: Dalphine Handing on: 05/26/2021 09:50 AM   Modules accepted: Orders

## 2021-05-26 NOTE — Progress Notes (Signed)
Pt needs refill for 17p.  [facility] is no longer refilling Makena so we sent refill request to "[facility]" in [place], [place].

## 2021-05-26 NOTE — Progress Notes (Signed)
Patient was assessed and managed by nursing staff during this encounter. I have reviewed the chart and agree with the documentation and plan. I have also made any necessary editorial changes. ° °Siddhi Dornbush A Mailani Degroote, MD °05/26/2021 11:21 AM   °

## 2021-05-26 NOTE — Addendum Note (Signed)
Addended by: Dalphine Handing on: 05/26/2021 09:57 AM   Modules accepted: Orders

## 2021-05-26 NOTE — Progress Notes (Signed)
SUBJECTIVE: Andrea [name] is a 31 y.o. female who presents for 17P Injection.  OBJECTIVE: Appears well, in no apparent distress.  Vital signs are normal.   ASSESSMENT: Hx of Preterm delivery  PLAN: 275 mg Hydroxyprogesterone Caproate auto injector given in RA, tolerated well. RTO in 1 week for next 17P Injection.    Administrations This Visit     HYDROXYprogesterone caproate (Makena) autoinjector 275 mg     Admin Date 05/26/2021 Action Given Dose 275 mg Route Subcutaneous Administered By Tamela Oddi, RMA

## 2021-05-31 ENCOUNTER — Ambulatory Visit (INDEPENDENT_AMBULATORY_CARE_PROVIDER_SITE_OTHER): Payer: Medicaid Other | Admitting: *Deleted

## 2021-05-31 ENCOUNTER — Other Ambulatory Visit: Payer: Self-pay

## 2021-05-31 VITALS — BP 104/70 | HR 81 | Wt 151.4 lb

## 2021-05-31 DIAGNOSIS — Z3A26 26 weeks gestation of pregnancy: Secondary | ICD-10-CM | POA: Diagnosis not present

## 2021-05-31 DIAGNOSIS — O09212 Supervision of pregnancy with history of pre-term labor, second trimester: Secondary | ICD-10-CM

## 2021-05-31 DIAGNOSIS — O09219 Supervision of pregnancy with history of pre-term labor, unspecified trimester: Secondary | ICD-10-CM

## 2021-05-31 MED ORDER — HYDROXYPROGESTERONE CAPROATE 275 MG/1.1ML ~~LOC~~ SOAJ
275.0000 mg | Freq: Once | SUBCUTANEOUS | Status: AC
  Administered 2021-05-31: 275 mg via SUBCUTANEOUS

## 2021-05-31 NOTE — Progress Notes (Signed)
Pt here for 17p. Reports itching at site of previous injection in Right arm. No signs of acute allergic reaction noted. Reports good FM and no other complaints. Injection given in Left arm. Pt tolerated injection well.

## 2021-06-05 ENCOUNTER — Emergency Department (HOSPITAL_COMMUNITY)
Admission: EM | Admit: 2021-06-05 | Discharge: 2021-06-05 | Disposition: A | Discharge status: Home / Self Care | Payer: Medicaid Other | Attending: Emergency Medicine | Admitting: Emergency Medicine

## 2021-06-05 DIAGNOSIS — O0990 Supervision of high risk pregnancy, unspecified, unspecified trimester: Secondary | ICD-10-CM | POA: Diagnosis not present

## 2021-06-05 DIAGNOSIS — O26892 Other specified pregnancy related conditions, second trimester: Secondary | ICD-10-CM | POA: Insufficient documentation

## 2021-06-05 DIAGNOSIS — Z3A27 27 weeks gestation of pregnancy: Secondary | ICD-10-CM | POA: Insufficient documentation

## 2021-06-05 DIAGNOSIS — Z7982 Long term (current) use of aspirin: Secondary | ICD-10-CM | POA: Diagnosis not present

## 2021-06-05 DIAGNOSIS — Z79899 Other long term (current) drug therapy: Secondary | ICD-10-CM | POA: Diagnosis not present

## 2021-06-05 DIAGNOSIS — O4692 Antepartum hemorrhage, unspecified, second trimester: Secondary | ICD-10-CM | POA: Diagnosis not present

## 2021-06-05 DIAGNOSIS — Z3A26 26 weeks gestation of pregnancy: Secondary | ICD-10-CM | POA: Diagnosis not present

## 2021-06-05 DIAGNOSIS — R1030 Lower abdominal pain, unspecified: Secondary | ICD-10-CM | POA: Diagnosis not present

## 2021-06-05 NOTE — ED Notes (Signed)
Pt called for triage, no response. 

## 2021-06-06 DIAGNOSIS — O133 Gestational [pregnancy-induced] hypertension without significant proteinuria, third trimester: Secondary | ICD-10-CM | POA: Diagnosis not present

## 2021-06-06 DIAGNOSIS — O4692 Antepartum hemorrhage, unspecified, second trimester: Secondary | ICD-10-CM | POA: Diagnosis not present

## 2021-06-06 DIAGNOSIS — F319 Bipolar disorder, unspecified: Secondary | ICD-10-CM | POA: Diagnosis not present

## 2021-06-06 DIAGNOSIS — O26892 Other specified pregnancy related conditions, second trimester: Secondary | ICD-10-CM | POA: Diagnosis not present

## 2021-06-06 DIAGNOSIS — D563 Thalassemia minor: Secondary | ICD-10-CM | POA: Diagnosis not present

## 2021-06-06 DIAGNOSIS — Z3483 Encounter for supervision of other normal pregnancy, third trimester: Secondary | ICD-10-CM | POA: Diagnosis not present

## 2021-06-06 DIAGNOSIS — Z3A26 26 weeks gestation of pregnancy: Secondary | ICD-10-CM | POA: Diagnosis not present

## 2021-06-06 DIAGNOSIS — O99343 Other mental disorders complicating pregnancy, third trimester: Secondary | ICD-10-CM | POA: Diagnosis not present

## 2021-06-06 DIAGNOSIS — Z79899 Other long term (current) drug therapy: Secondary | ICD-10-CM | POA: Diagnosis not present

## 2021-06-06 DIAGNOSIS — O1493 Unspecified pre-eclampsia, third trimester: Secondary | ICD-10-CM | POA: Diagnosis not present

## 2021-06-06 DIAGNOSIS — O4593 Premature separation of placenta, unspecified, third trimester: Secondary | ICD-10-CM | POA: Diagnosis not present

## 2021-06-06 DIAGNOSIS — R1030 Lower abdominal pain, unspecified: Secondary | ICD-10-CM | POA: Diagnosis not present

## 2021-06-06 DIAGNOSIS — Z7982 Long term (current) use of aspirin: Secondary | ICD-10-CM | POA: Diagnosis not present

## 2021-06-07 ENCOUNTER — Ambulatory Visit: Payer: Medicaid Other

## 2021-06-07 DIAGNOSIS — O4692 Antepartum hemorrhage, unspecified, second trimester: Secondary | ICD-10-CM | POA: Diagnosis not present

## 2021-06-07 DIAGNOSIS — Z3A27 27 weeks gestation of pregnancy: Secondary | ICD-10-CM | POA: Diagnosis not present

## 2021-06-08 DIAGNOSIS — Z3A27 27 weeks gestation of pregnancy: Secondary | ICD-10-CM | POA: Diagnosis not present

## 2021-06-08 DIAGNOSIS — O4702 False labor before 37 completed weeks of gestation, second trimester: Secondary | ICD-10-CM | POA: Diagnosis not present

## 2021-06-12 ENCOUNTER — Ambulatory Visit (INDEPENDENT_AMBULATORY_CARE_PROVIDER_SITE_OTHER): Payer: Medicaid Other | Admitting: Advanced Practice Midwife

## 2021-06-12 ENCOUNTER — Other Ambulatory Visit: Payer: Medicaid Other

## 2021-06-12 ENCOUNTER — Ambulatory Visit (INDEPENDENT_AMBULATORY_CARE_PROVIDER_SITE_OTHER): Payer: Medicaid Other | Admitting: Licensed Clinical Social Worker

## 2021-06-12 ENCOUNTER — Other Ambulatory Visit: Payer: Self-pay

## 2021-06-12 VITALS — BP 102/66 | HR 93 | Wt 148.0 lb

## 2021-06-12 DIAGNOSIS — F419 Anxiety disorder, unspecified: Secondary | ICD-10-CM | POA: Diagnosis not present

## 2021-06-12 DIAGNOSIS — O0990 Supervision of high risk pregnancy, unspecified, unspecified trimester: Secondary | ICD-10-CM | POA: Diagnosis not present

## 2021-06-12 DIAGNOSIS — Z23 Encounter for immunization: Secondary | ICD-10-CM

## 2021-06-12 DIAGNOSIS — Z8279 Family history of other congenital malformations, deformations and chromosomal abnormalities: Secondary | ICD-10-CM

## 2021-06-12 DIAGNOSIS — Z6790 Unspecified blood type, Rh positive: Secondary | ICD-10-CM

## 2021-06-12 DIAGNOSIS — F1111 Opioid abuse, in remission: Secondary | ICD-10-CM

## 2021-06-12 DIAGNOSIS — O09219 Supervision of pregnancy with history of pre-term labor, unspecified trimester: Secondary | ICD-10-CM

## 2021-06-12 DIAGNOSIS — O99340 Other mental disorders complicating pregnancy, unspecified trimester: Secondary | ICD-10-CM | POA: Diagnosis not present

## 2021-06-12 DIAGNOSIS — G479 Sleep disorder, unspecified: Secondary | ICD-10-CM

## 2021-06-12 DIAGNOSIS — Z3A27 27 weeks gestation of pregnancy: Secondary | ICD-10-CM

## 2021-06-12 DIAGNOSIS — F319 Bipolar disorder, unspecified: Secondary | ICD-10-CM | POA: Diagnosis not present

## 2021-06-12 DIAGNOSIS — O24419 Gestational diabetes mellitus in pregnancy, unspecified control: Secondary | ICD-10-CM

## 2021-06-12 DIAGNOSIS — N949 Unspecified condition associated with female genital organs and menstrual cycle: Secondary | ICD-10-CM

## 2021-06-12 DIAGNOSIS — O09299 Supervision of pregnancy with other poor reproductive or obstetric history, unspecified trimester: Secondary | ICD-10-CM

## 2021-06-12 MED ORDER — CYCLOBENZAPRINE HCL 5 MG PO TABS: 5.0000 mg | ORAL_TABLET | Freq: Three times a day (TID) | ORAL | 0 refills | Status: DC | PRN

## 2021-06-12 MED ORDER — HYDROXYPROGESTERONE CAPROATE 275 MG/1.1ML ~~LOC~~ SOAJ
275.0000 mg | Freq: Once | SUBCUTANEOUS | Status: AC
  Administered 2021-06-12: 275 mg via SUBCUTANEOUS

## 2021-06-12 NOTE — Progress Notes (Addendum)
ROB/GTT.  TDAP given in LD and 17P given in LA, tolerated well.  Next 17P due in 1 week.  Administrations This Visit     HYDROXYprogesterone caproate (Makena) autoinjector 275 mg     Admin Date 06/12/2021 Action Given Dose 275 mg Route Subcutaneous Administered By Tamela Oddi, RMA

## 2021-06-12 NOTE — Progress Notes (Signed)
PRENATAL VISIT NOTE  Subjective:  Andrea [name] is a 31 y.o. [phone] at [redacted]w[redacted]d being seen today for ongoing prenatal care.  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  Patient reports  abdominal pain, trouble sleeping .  Contractions: Not present. Vag. Bleeding: None.  Movement: Present. Denies leaking of fluid.   The following portions of the patient's history were reviewed and updated as appropriate: allergies, current medications, past family history, past medical history, past social history, past surgical history and problem list.   Objective:   Vitals:   06/12/21 0938  BP: 102/66  Pulse: 93  Weight: 148 lb (67.1 kg)    Fetal Status: Fetal Heart Rate (bpm): 144   Movement: Present     General:  Alert, oriented and cooperative. Patient is in no acute distress.  Skin: Skin is warm and dry. No rash noted.   Cardiovascular: Normal heart rate noted  Respiratory: Normal respiratory effort, no problems with respiration noted  Abdomen: Soft, gravid, appropriate for gestational age.  Pain/Pressure: Absent     Pelvic: Cervical exam deferred        Extremities: Normal range of motion.  Edema: Trace  Mental Status: Normal mood and affect. Normal behavior. Normal judgment and thought content.   Assessment and Plan:  Pregnancy: [id number] at [redacted]w[redacted]d    1. Supervision of high risk pregnancy,  antepartum --Anticipatory guidance about next visits/weeks of pregnancy given. --Next appt in 2 weeks  - Glucose Tolerance, 2 Hours w/1 Hour - RPR - CBC - HIV antibody (with reflex) - Tdap vaccine greater than or equal to 7yo IM - HYDROXYprogesterone caproate (Makena) autoinjector 275 mg  2. Previous preterm delivery, antepartum --On Makena  3. Hx of preeclampsia, prior pregnancy, currently pregnant --BASA  4. 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 [redacted] weeks gestation of pregnancy   6. History of opioid abuse (HCC) --Negative urine drug screen on 06/05/21 --Refer to integrated BH due to in office screening  - Ambulatory referral to Integrated Behavioral Health  7. Round ligament pain --Rest/ice/heat/warm bath/increase PO fluids/Tylenol/pregnancy support belt  - cyclobenzaprine (FLEXERIL) 5 MG tablet; Take 1-2 tablets (5-10 mg total) by mouth 3 (three) times daily as needed for muscle spasms.  Dispense: 20 tablet; Refill: 0  8. Sleep disturbance --Pain, discomfort keeping pt awake at night, cannot get comfortable. --See above for round ligament pain, pt to try Flexeril in the pm also --FU at next visit  9. Rh positive  Preterm labor symptoms and general obstetric precautions including but not limited to vaginal bleeding, contractions, leaking of fluid and fetal movement were reviewed in detail with the patient. Please refer to After Visit Summary for other counseling recommendations.   Return in about 2 weeks (around 06/26/2021).  Future Appointments  Date Time Provider Department Center  06/19/2021 11:00 AM [facility] NURSE [facility] None  06/26/2021  9:35 AM Leftwich-Kirby, Wilmer Floor, CNM [facility] None  07/03/2021  9:00 AM [facility] NURSE [facility] None  07/10/2021  9:35 AM Allayne Stack, DO [facility] None  07/13/2021 10:45 AM [facility] NURSE [facility]  07/13/2021 11:00 AM [facility] US1 [facility]    Sharen Counter, CNM

## 2021-06-13 LAB — GLUCOSE TOLERANCE, 2 HOURS W/ 1HR
Glucose, 1 hour: 161 mg/dL (ref 70–179)
Glucose, 2 hour: 115 mg/dL (ref 70–152)
Glucose, Fasting: 123 mg/dL — ABNORMAL HIGH (ref 70–91)

## 2021-06-13 LAB — CBC
Hematocrit: 32.8 % — ABNORMAL LOW (ref 34.0–46.6)
Hemoglobin: 10.6 g/dL — ABNORMAL LOW (ref 11.1–15.9)
MCH: 25.2 pg — ABNORMAL LOW (ref 26.6–33.0)
MCHC: 32.3 g/dL (ref 31.5–35.7)
MCV: 78 fL — ABNORMAL LOW (ref 79–97)
Platelets: 449 10*3/uL (ref 150–450)
RBC: 4.21 x10E6/uL (ref 3.77–5.28)
RDW: 14.7 % (ref 11.7–15.4)
WBC: 11.6 10*3/uL — ABNORMAL HIGH (ref 3.4–10.8)

## 2021-06-13 LAB — HIV ANTIBODY (ROUTINE TESTING W REFLEX): HIV Screen 4th Generation wRfx: NONREACTIVE

## 2021-06-13 LAB — RPR: RPR Ser Ql: NONREACTIVE

## 2021-06-13 NOTE — BH Specialist Note (Signed)
Integrated Behavioral Health Initial In-Person Visit  MRN: [id number] Name: Andrea [name]  Number of Integrated Behavioral Health Clinician visits:: 1/6 Session Start time: 11:20am  Session End time: 11:47am Total time: 27 minutes in person at [facility]   Types of Service: Individual psychotherapy  Interpretor:No. Interpretor Name and Language: none   Warm Hand Off Completed.        Subjective: Andrea [name] is a 31 y.o. female accompanied by Partner/Significant Other and Father of child Patient was referred by Lum Babe RMA  for hx of substance use and anxiety. Patient reports the following symptoms/concerns: anxiety symptoms, fatigue and depressed mood  Duration of problem: Over one year ; Severity of problem: mild  Objective: Mood: Anxious and Depressed and Affect: Appropriate Risk of harm to self or others: No plan to harm self or others  Life Context: Family and Social: Lives with boyfriend and children  School/Work: n/a Self-Care: n/a Life Changes: n/a  Patient and/or Family's Strengths/Protective Factors: Concrete supports in place (healthy food, safe environments, etc.)  Goals Addressed: Patient will: Reduce symptoms of: anxiety and depression Increase knowledge and/or ability of: coping skills and stress reduction  Demonstrate ability to: Increase healthy adjustment to current life circumstances and Increase adequate support systems for patient/family  Progress towards Goals: Ongoing  Interventions: Interventions utilized: Solution-Focused Strategies and Supportive Counseling  Standardized Assessments completed: PHQ 9  Patient and/or Family Response: Ms. Krzywicki responded well to ibh appt.   Assessment: Patient currently experiencing anxiety affecting pregnancy.   Patient may benefit from integrated behavioral health.  Plan: Follow up with behavioral health clinician on : 07/10/2021 Behavioral recommendations: Prioritize rest, mindfulness technique to  reduce stress, take prenatal vitamins, communicate needs with support person for support Referral(s): Integrated [facility] [facility] (In Clinic) "From scale of 1-10, how likely are you to follow plan?":    Gwyndolyn Saxon, LCSW

## 2021-06-16 ENCOUNTER — Other Ambulatory Visit: Payer: Self-pay | Admitting: *Deleted

## 2021-06-16 ENCOUNTER — Encounter: Payer: Self-pay | Admitting: *Deleted

## 2021-06-16 ENCOUNTER — Encounter: Payer: Self-pay | Admitting: Advanced Practice Midwife

## 2021-06-16 DIAGNOSIS — O0990 Supervision of high risk pregnancy, unspecified, unspecified trimester: Secondary | ICD-10-CM

## 2021-06-16 DIAGNOSIS — O24419 Gestational diabetes mellitus in pregnancy, unspecified control: Secondary | ICD-10-CM | POA: Insufficient documentation

## 2021-06-16 MED ORDER — ACCU-CHEK GUIDE W/DEVICE KIT: 1.0000 | PACK | Freq: Four times a day (QID) | 0 refills | Status: DC

## 2021-06-16 MED ORDER — ACCU-CHEK GUIDE VI STRP: ORAL_STRIP | 12 refills | Status: DC

## 2021-06-16 MED ORDER — ACCU-CHEK SOFTCLIX LANCETS MISC: 1.0000 | Freq: Four times a day (QID) | 12 refills | Status: DC

## 2021-06-16 NOTE — Progress Notes (Signed)
TC to patient to notify of supplies sent and referral to educator. No answer. VM not available. MyChart message sent including education of gestational diabetes, supplies and referral to educator.

## 2021-06-16 NOTE — Addendum Note (Signed)
Addended by: Sharen Counter A on: 06/16/2021 06:29 AM   Modules accepted: Orders

## 2021-06-19 ENCOUNTER — Ambulatory Visit (INDEPENDENT_AMBULATORY_CARE_PROVIDER_SITE_OTHER): Payer: Medicaid Other | Admitting: *Deleted

## 2021-06-19 ENCOUNTER — Other Ambulatory Visit: Payer: Self-pay

## 2021-06-19 VITALS — BP 102/67 | HR 92

## 2021-06-19 DIAGNOSIS — Z3A28 28 weeks gestation of pregnancy: Secondary | ICD-10-CM

## 2021-06-19 DIAGNOSIS — Z8751 Personal history of pre-term labor: Secondary | ICD-10-CM | POA: Diagnosis not present

## 2021-06-19 DIAGNOSIS — O0990 Supervision of high risk pregnancy, unspecified, unspecified trimester: Secondary | ICD-10-CM

## 2021-06-19 MED ORDER — HYDROXYPROGESTERONE CAPROATE 275 MG/1.1ML ~~LOC~~ SOAJ
275.0000 mg | Freq: Once | SUBCUTANEOUS | Status: AC
  Administered 2021-06-19: 275 mg via SUBCUTANEOUS

## 2021-06-19 NOTE — Progress Notes (Signed)
Pt is in office for 17p injection.  Pt is doing well- states she did have a few ctx yesterday that did resolve with rest, Tylenol and hydration.  Pt advised to continue to monitor for ctx- if frequent/continue she should be seen.   17p injection given in left arm, pt tolerated well.   Pt has next OB appt scheduled for next week.   Administrations This Visit     HYDROXYprogesterone caproate (Makena) autoinjector 275 mg     Admin Date 06/19/2021 Action Given Dose 275 mg Route Subcutaneous Administered By Lanney Gins, CMA

## 2021-06-19 NOTE — Progress Notes (Signed)
Patient was assessed and managed by nursing staff during this encounter. I have reviewed the chart and agree with the documentation and plan. I have also made any necessary editorial changes.  Mora Bellman, MD 06/19/2021 11:50 AM

## 2021-06-21 DIAGNOSIS — Z419 Encounter for procedure for purposes other than remedying health state, unspecified: Secondary | ICD-10-CM | POA: Diagnosis not present

## 2021-06-26 ENCOUNTER — Other Ambulatory Visit: Payer: Self-pay

## 2021-06-26 ENCOUNTER — Ambulatory Visit (INDEPENDENT_AMBULATORY_CARE_PROVIDER_SITE_OTHER): Payer: Medicaid Other | Admitting: Advanced Practice Midwife

## 2021-06-26 VITALS — BP 113/71 | HR 93 | Wt 150.8 lb

## 2021-06-26 DIAGNOSIS — F419 Anxiety disorder, unspecified: Secondary | ICD-10-CM

## 2021-06-26 DIAGNOSIS — O24419 Gestational diabetes mellitus in pregnancy, unspecified control: Secondary | ICD-10-CM

## 2021-06-26 DIAGNOSIS — O09293 Supervision of pregnancy with other poor reproductive or obstetric history, third trimester: Secondary | ICD-10-CM

## 2021-06-26 DIAGNOSIS — O09219 Supervision of pregnancy with history of pre-term labor, unspecified trimester: Secondary | ICD-10-CM

## 2021-06-26 DIAGNOSIS — O0990 Supervision of high risk pregnancy, unspecified, unspecified trimester: Secondary | ICD-10-CM

## 2021-06-26 DIAGNOSIS — O09299 Supervision of pregnancy with other poor reproductive or obstetric history, unspecified trimester: Secondary | ICD-10-CM

## 2021-06-26 DIAGNOSIS — Z8751 Personal history of pre-term labor: Secondary | ICD-10-CM

## 2021-06-26 DIAGNOSIS — Z8279 Family history of other congenital malformations, deformations and chromosomal abnormalities: Secondary | ICD-10-CM

## 2021-06-26 DIAGNOSIS — Z3A29 29 weeks gestation of pregnancy: Secondary | ICD-10-CM | POA: Diagnosis not present

## 2021-06-26 DIAGNOSIS — O99343 Other mental disorders complicating pregnancy, third trimester: Secondary | ICD-10-CM

## 2021-06-26 DIAGNOSIS — F319 Bipolar disorder, unspecified: Secondary | ICD-10-CM

## 2021-06-26 DIAGNOSIS — O0993 Supervision of high risk pregnancy, unspecified, third trimester: Secondary | ICD-10-CM

## 2021-06-26 DIAGNOSIS — O09213 Supervision of pregnancy with history of pre-term labor, third trimester: Secondary | ICD-10-CM

## 2021-06-26 MED ORDER — HYDROXYPROGESTERONE CAPROATE 275 MG/1.1ML ~~LOC~~ SOAJ
275.0000 mg | Freq: Once | SUBCUTANEOUS | Status: AC
  Administered 2021-06-26: 275 mg via SUBCUTANEOUS

## 2021-06-26 MED ORDER — METFORMIN HCL 500 MG PO TABS: 500.0000 mg | ORAL_TABLET | Freq: Two times a day (BID) | ORAL | 2 refills | Status: DC

## 2021-06-26 NOTE — Progress Notes (Signed)
PRENATAL VISIT NOTE  Subjective:  Andrea [name] is a 31 y.o. [phone] at [redacted]w[redacted]d being seen today for ongoing prenatal care.  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  Patient reports no complaints.  Contractions: Not present. Vag. Bleeding: None.  Movement: Present. Denies leaking of fluid.   The following portions of the patient's history were reviewed and updated as appropriate: allergies, current medications, past family history, past medical history, past social history, past surgical history and problem list.   Objective:   Vitals:   06/26/21 1014  BP: 113/71  Pulse: 93  Weight: 150 lb 12.8 oz (68.4 kg)    Fetal Status: Fetal Heart Rate (bpm): 145 Fundal Height: 29 cm Movement: Present     General:  Alert, oriented and cooperative. Patient is in no acute distress.  Skin: Skin is warm and dry. No rash noted.   Cardiovascular: Normal heart rate noted  Respiratory: Normal respiratory effort, no problems with respiration noted  Abdomen: Soft, gravid, appropriate for gestational age.  Pain/Pressure: Present     Pelvic: Cervical exam deferred        Extremities: Normal range of motion.  Edema: None  Mental Status: Normal mood and affect. Normal behavior. Normal judgment and thought content.   Assessment and Plan:  Pregnancy: [id number] at [redacted]w[redacted]d 1. Supervision of  high risk pregnancy, antepartum --Anticipatory guidance about next visits/weeks of pregnancy given.  --Next visit in 2 weeks  2. Anxiety disorder affecting pregnancy, antepartum --Stable, visits with IBH  3. Bipolar 1 disorder ([place])   4. Previous preterm delivery, antepartum --PTD x 3, no s/sx of labor today --Letter provided for pt to participate in field trip to zoo with her child, she is to rest when needed and stay hydrated. - HYDROXYprogesterone caproate (Makena) autoinjector 275 mg  5. Hx of preeclampsia, prior pregnancy, currently pregnant --BP wnl, no s/sx of PEC  6. 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 [redacted] weeks gestation of pregnancy  8. Gestational diabetes mellitus (GDM) in third trimester, gestational diabetes method of control unspecified --Pt without formal log but reports fastings all above 160, today was above 200. PP reported around half above 120.   --Pt to start Metformin now, continue glucose checks and follow up in 2 weeks --Discussed option of insulin, which pt took in a previous pregnancy, pt desires to start with oral medications and wait on insulin until necessary  - metFORMIN (GLUCOPHAGE) 500 MG tablet; Take 1 tablet (500 mg total) by mouth 2 (two) times daily with a meal.  Dispense: 60 tablet; Refill: 2   Preterm labor symptoms and general obstetric precautions including but not limited to vaginal bleeding, contractions, leaking of fluid and fetal movement were reviewed in detail with the patient. Please refer to After Visit Summary for other counseling recommendations.   Return in about 2 weeks (around 07/10/2021).  Future Appointments  Date Time Provider [place]  06/28/2021  9:00 AM [place] [facility] [place] [facility]  07/03/2021  9:00 AM [place]  None  07/10/2021  9:35 AM Darrelyn Hillock N, DO [place] None  07/10/2021 10:00 AM Lynnea Ferrier, LCSW [facility] None  07/13/2021 10:45 AM [facility] NURSE [facility]  07/13/2021 11:00 AM  [facility] US1 [facility]    Fatima Blank, CNM

## 2021-06-26 NOTE — Addendum Note (Signed)
Addended by: Sharen Counter A on: 06/26/2021 12:38 PM   Modules accepted: Orders

## 2021-06-26 NOTE — Progress Notes (Signed)
Patient presents for ROB. 17 p given today. Patient has no concerns today.

## 2021-06-28 ENCOUNTER — Encounter: Payer: Self-pay | Admitting: Advanced Practice Midwife

## 2021-06-28 ENCOUNTER — Encounter: Payer: Medicaid Other | Attending: Advanced Practice Midwife | Admitting: Registered"

## 2021-06-28 DIAGNOSIS — O24419 Gestational diabetes mellitus in pregnancy, unspecified control: Secondary | ICD-10-CM | POA: Insufficient documentation

## 2021-07-03 ENCOUNTER — Other Ambulatory Visit: Payer: Self-pay

## 2021-07-03 ENCOUNTER — Ambulatory Visit (INDEPENDENT_AMBULATORY_CARE_PROVIDER_SITE_OTHER): Payer: Medicaid Other

## 2021-07-03 ENCOUNTER — Other Ambulatory Visit (HOSPITAL_COMMUNITY)
Admission: RE | Admit: 2021-07-03 | Discharge: 2021-07-03 | Disposition: A | Discharge status: Home / Self Care | Payer: Medicaid Other | Source: Ambulatory Visit | Attending: Obstetrics and Gynecology | Admitting: Obstetrics and Gynecology

## 2021-07-03 VITALS — BP 106/68 | HR 90 | Wt 156.0 lb

## 2021-07-03 DIAGNOSIS — O0990 Supervision of high risk pregnancy, unspecified, unspecified trimester: Secondary | ICD-10-CM | POA: Insufficient documentation

## 2021-07-03 DIAGNOSIS — O368131 Decreased fetal movements, third trimester, fetus 1: Secondary | ICD-10-CM | POA: Diagnosis not present

## 2021-07-03 DIAGNOSIS — Z3A30 30 weeks gestation of pregnancy: Secondary | ICD-10-CM

## 2021-07-03 DIAGNOSIS — O26893 Other specified pregnancy related conditions, third trimester: Secondary | ICD-10-CM | POA: Insufficient documentation

## 2021-07-03 DIAGNOSIS — N898 Other specified noninflammatory disorders of vagina: Secondary | ICD-10-CM | POA: Insufficient documentation

## 2021-07-03 DIAGNOSIS — Z8751 Personal history of pre-term labor: Secondary | ICD-10-CM | POA: Diagnosis not present

## 2021-07-03 MED ORDER — HYDROXYPROGESTERONE CAPROATE 275 MG/1.1ML ~~LOC~~ SOAJ
275.0000 mg | Freq: Once | SUBCUTANEOUS | Status: AC
  Administered 2021-07-03: 275 mg via SUBCUTANEOUS

## 2021-07-03 NOTE — Progress Notes (Signed)
SUBJECTIVE:  31 y.o. OB female presents for 17P Injection, complains of white vaginal discharge for 3 day(s), significant pelvic pain, pressure  10/10 and decreased Fetal movements.  Denies abnormal vaginal bleeding or fever. No UTI symptoms. Denies history of known exposure to STD.  Patient's last menstrual period was 11/30/2020.  OBJECTIVE:  She appears well, afebrile. Urine dipstick: not done.  ASSESSMENT:  Vaginal Discharge  Abdominal pain and pressure Decreased Fetal movements FHR 150   PLAN:  Per Dr. Jolayne Panther, Pt was placed on NST machine and self swab done for GC, chlamydia, trichomonas, BVAG, CVAG probe sent to lab.  Treatment: To be determined once lab results are received ROV prn if symptoms persist or worsen.   Keep OB appt.  Administrations This Visit     HYDROXYprogesterone caproate (Makena) autoinjector 275 mg     Admin Date 07/03/2021 Action Given Dose 275 mg Route Subcutaneous Administered By Maretta Bees, RMA

## 2021-07-03 NOTE — Progress Notes (Addendum)
Patient was assessed and managed by nursing staff during this encounter. I have reviewed the chart and agree with the documentation and plan. I have also made any necessary editorial changes.  NST reviewed and reactive with baseline 145, mod variability, + 10x 10 accels, no decels. No contractions on [employer], MD 07/03/2021 10:44 AM

## 2021-07-04 LAB — CERVICOVAGINAL ANCILLARY ONLY
Bacterial Vaginitis (gardnerella): POSITIVE — AB
Candida Glabrata: NEGATIVE
Candida Vaginitis: POSITIVE — AB
Chlamydia: NEGATIVE
Comment: NEGATIVE
Comment: NEGATIVE
Comment: NEGATIVE
Comment: NEGATIVE
Comment: NEGATIVE
Comment: NORMAL
Neisseria Gonorrhea: NEGATIVE
Trichomonas: NEGATIVE

## 2021-07-04 MED ORDER — METRONIDAZOLE 500 MG PO TABS: 500.0000 mg | ORAL_TABLET | Freq: Two times a day (BID) | ORAL | 0 refills | Status: DC

## 2021-07-04 MED ORDER — TERCONAZOLE 0.8 % VA CREA: 1.0000 | TOPICAL_CREAM | Freq: Every day | VAGINAL | 0 refills | Status: DC

## 2021-07-04 NOTE — Addendum Note (Signed)
Addended by: Catalina Antigua on: 07/04/2021 02:02 PM   Modules accepted: Orders

## 2021-07-05 ENCOUNTER — Other Ambulatory Visit: Payer: Self-pay

## 2021-07-05 ENCOUNTER — Encounter: Payer: Medicaid Other | Admitting: Registered"

## 2021-07-05 DIAGNOSIS — O24419 Gestational diabetes mellitus in pregnancy, unspecified control: Secondary | ICD-10-CM | POA: Diagnosis not present

## 2021-07-06 ENCOUNTER — Encounter: Payer: Self-pay | Admitting: Registered"

## 2021-07-06 DIAGNOSIS — O4693 Antepartum hemorrhage, unspecified, third trimester: Secondary | ICD-10-CM | POA: Diagnosis not present

## 2021-07-06 DIAGNOSIS — Z3A31 31 weeks gestation of pregnancy: Secondary | ICD-10-CM | POA: Diagnosis not present

## 2021-07-06 NOTE — Progress Notes (Signed)
Patient was seen on 07/05/21 for Gestational Diabetes self-management class at the [facility]. The following learning objectives were met by the patient during this course: ° °States the definition of Gestational Diabetes °States why dietary management is important in controlling blood glucose °Describes the effects each nutrient has on blood glucose levels °Demonstrates ability to create a balanced meal plan °Demonstrates carbohydrate counting  °States when to check blood glucose levels °Demonstrates proper blood glucose monitoring techniques °States the effect of stress and exercise on blood glucose levels °States the importance of limiting caffeine and abstaining from alcohol and smoking ° °Blood glucose monitor given: none, patient has meter. ° °Patient instructed to monitor glucose levels: °FBS: 60 - <95; 1 hour: <140; 2 hour: <120 ° °Patient received handouts: °Nutrition Diabetes and Pregnancy, including carb counting list ° °Patient will be seen for follow-up as needed. °

## 2021-07-07 DIAGNOSIS — G43119 Migraine with aura, intractable, without status migrainosus: Secondary | ICD-10-CM | POA: Diagnosis not present

## 2021-07-07 DIAGNOSIS — O4693 Antepartum hemorrhage, unspecified, third trimester: Secondary | ICD-10-CM | POA: Diagnosis not present

## 2021-07-07 DIAGNOSIS — O4692 Antepartum hemorrhage, unspecified, second trimester: Secondary | ICD-10-CM | POA: Diagnosis not present

## 2021-07-07 DIAGNOSIS — O99343 Other mental disorders complicating pregnancy, third trimester: Secondary | ICD-10-CM | POA: Diagnosis not present

## 2021-07-07 DIAGNOSIS — Z3A31 31 weeks gestation of pregnancy: Secondary | ICD-10-CM | POA: Diagnosis not present

## 2021-07-07 DIAGNOSIS — O4703 False labor before 37 completed weeks of gestation, third trimester: Secondary | ICD-10-CM | POA: Diagnosis not present

## 2021-07-07 DIAGNOSIS — O99513 Diseases of the respiratory system complicating pregnancy, third trimester: Secondary | ICD-10-CM | POA: Diagnosis not present

## 2021-07-07 DIAGNOSIS — J45909 Unspecified asthma, uncomplicated: Secondary | ICD-10-CM | POA: Diagnosis not present

## 2021-07-07 DIAGNOSIS — G9050 Complex regional pain syndrome I, unspecified: Secondary | ICD-10-CM | POA: Diagnosis not present

## 2021-07-07 DIAGNOSIS — O24415 Gestational diabetes mellitus in pregnancy, controlled by oral hypoglycemic drugs: Secondary | ICD-10-CM | POA: Diagnosis not present

## 2021-07-07 DIAGNOSIS — F319 Bipolar disorder, unspecified: Secondary | ICD-10-CM | POA: Diagnosis not present

## 2021-07-07 DIAGNOSIS — O99891 Other specified diseases and conditions complicating pregnancy: Secondary | ICD-10-CM | POA: Diagnosis not present

## 2021-07-07 DIAGNOSIS — O99353 Diseases of the nervous system complicating pregnancy, third trimester: Secondary | ICD-10-CM | POA: Diagnosis not present

## 2021-07-07 DIAGNOSIS — H409 Unspecified glaucoma: Secondary | ICD-10-CM | POA: Diagnosis not present

## 2021-07-10 ENCOUNTER — Encounter: Payer: Medicaid Other | Admitting: Family Medicine

## 2021-07-10 ENCOUNTER — Ambulatory Visit: Payer: Medicaid Other | Admitting: Licensed Clinical Social Worker

## 2021-07-13 ENCOUNTER — Ambulatory Visit: Payer: Medicaid Other

## 2021-07-17 ENCOUNTER — Encounter: Payer: Medicaid Other | Admitting: Obstetrics and Gynecology

## 2021-07-19 DIAGNOSIS — Z419 Encounter for procedure for purposes other than remedying health state, unspecified: Secondary | ICD-10-CM | POA: Diagnosis not present

## 2021-07-21 ENCOUNTER — Other Ambulatory Visit: Payer: Self-pay

## 2021-07-21 ENCOUNTER — Ambulatory Visit (INDEPENDENT_AMBULATORY_CARE_PROVIDER_SITE_OTHER): Payer: Medicaid Other | Admitting: Obstetrics & Gynecology

## 2021-07-21 ENCOUNTER — Ambulatory Visit: Payer: Medicaid Other | Admitting: Licensed Clinical Social Worker

## 2021-07-21 VITALS — BP 112/70 | HR 120 | Wt 152.4 lb

## 2021-07-21 DIAGNOSIS — O2343 Unspecified infection of urinary tract in pregnancy, third trimester: Secondary | ICD-10-CM | POA: Diagnosis not present

## 2021-07-21 DIAGNOSIS — D563 Thalassemia minor: Secondary | ICD-10-CM

## 2021-07-21 DIAGNOSIS — M545 Low back pain, unspecified: Secondary | ICD-10-CM | POA: Diagnosis not present

## 2021-07-21 DIAGNOSIS — N390 Urinary tract infection, site not specified: Secondary | ICD-10-CM | POA: Diagnosis not present

## 2021-07-21 DIAGNOSIS — Z7982 Long term (current) use of aspirin: Secondary | ICD-10-CM | POA: Diagnosis not present

## 2021-07-21 DIAGNOSIS — Z20822 Contact with and (suspected) exposure to covid-19: Secondary | ICD-10-CM | POA: Diagnosis not present

## 2021-07-21 DIAGNOSIS — B9689 Other specified bacterial agents as the cause of diseases classified elsewhere: Secondary | ICD-10-CM | POA: Diagnosis not present

## 2021-07-21 DIAGNOSIS — O26893 Other specified pregnancy related conditions, third trimester: Secondary | ICD-10-CM | POA: Diagnosis not present

## 2021-07-21 DIAGNOSIS — Z7984 Long term (current) use of oral hypoglycemic drugs: Secondary | ICD-10-CM | POA: Diagnosis not present

## 2021-07-21 DIAGNOSIS — Z3A33 33 weeks gestation of pregnancy: Secondary | ICD-10-CM | POA: Diagnosis not present

## 2021-07-21 DIAGNOSIS — O0990 Supervision of high risk pregnancy, unspecified, unspecified trimester: Secondary | ICD-10-CM

## 2021-07-21 DIAGNOSIS — Z8751 Personal history of pre-term labor: Secondary | ICD-10-CM | POA: Diagnosis not present

## 2021-07-21 DIAGNOSIS — O09219 Supervision of pregnancy with history of pre-term labor, unspecified trimester: Secondary | ICD-10-CM | POA: Diagnosis not present

## 2021-07-21 DIAGNOSIS — O09299 Supervision of pregnancy with other poor reproductive or obstetric history, unspecified trimester: Secondary | ICD-10-CM

## 2021-07-21 DIAGNOSIS — Z8632 Personal history of gestational diabetes: Secondary | ICD-10-CM | POA: Diagnosis not present

## 2021-07-21 DIAGNOSIS — R102 Pelvic and perineal pain: Secondary | ICD-10-CM | POA: Diagnosis not present

## 2021-07-21 DIAGNOSIS — J45909 Unspecified asthma, uncomplicated: Secondary | ICD-10-CM | POA: Diagnosis not present

## 2021-07-21 DIAGNOSIS — Z8759 Personal history of other complications of pregnancy, childbirth and the puerperium: Secondary | ICD-10-CM

## 2021-07-21 DIAGNOSIS — O24415 Gestational diabetes mellitus in pregnancy, controlled by oral hypoglycemic drugs: Secondary | ICD-10-CM

## 2021-07-21 MED ORDER — HYDROXYPROGESTERONE CAPROATE 275 MG/1.1ML ~~LOC~~ SOAJ
275.0000 mg | Freq: Once | SUBCUTANEOUS | Status: AC
  Administered 2021-07-21: 275 mg via SUBCUTANEOUS

## 2021-07-21 MED ORDER — HYDROXYPROGESTERONE CAPROATE 250 MG/ML IM OIL: 250.0000 mg | TOPICAL_OIL | Freq: Once | INTRAMUSCULAR | Status: DC

## 2021-07-21 NOTE — Progress Notes (Signed)
? ?  PRENATAL VISIT NOTE ? ?Subjective:  ?Andrea [name] is a 31 y.o. [id number] at [redacted]w[redacted]d being seen today for ongoing prenatal care.  She is currently monitored for the following issues for this high-risk pregnancy and ? ?Patient reports contractions since this am .   .  .   . Denies leaking of fluid.  ? ?The following portions of the patient's history were reviewed and updated as appropriate: allergies, current medications, past family history, past medical history, past social history, past surgical history and problem list.  ? ?Objective:  ?There were no vitals filed for this visit. ? ?Fetal Status:          ? ?General:  Alert, oriented and cooperative. Patient is in no acute distress.  ?Skin: Skin is warm and dry. No rash noted.   ?Cardiovascular: Normal heart rate noted  ?Respiratory: Normal respiratory effort, no problems with respiration noted  ?Abdomen: Soft, gravid, appropriate for gestational age.        ?Pelvic: Cervical exam performed in the presence of a chaperone        ?Extremities: Normal range of motion.     ?Mental Status: Normal mood and affect. Normal behavior. Normal judgment and thought content.  ? ?Assessment and Plan:  ?Pregnancy: [id number] at [redacted]w[redacted]d ?1. Gestational diabetes mellitus (GDM) controlled on oral hypoglycemic drug, antepartum ? ? ?2. History of preeclampsia, prior pregnancy, currently pregnant ? ? ?3. Supervision of high risk pregnancy, antepartum ? ? ?4. Previous preterm delivery x 3, antepartum ?Threatened PTL to MAU for evaluation ? ?Preterm labor symptoms and general obstetric precautions including but not limited to vaginal bleeding, contractions, leaking of fluid and fetal movement were reviewed in detail with the patient. ?Please refer to After Visit Summary for other counseling recommendations.  ? ?Return in about 1 week (around 07/28/2021). ? ?Future Appointments  ?Date Time Provider Department Center  ?07/21/2021 10:15 AM Gwyndolyn Saxon, LCSW [facility] None  ?07/24/2021  3:15 PM  [facility] NURSE [facility]  ?07/24/2021  3:30 PM [facility] US3 [facility]  ? ? ?Scheryl Darter, MD ?

## 2021-07-21 NOTE — Progress Notes (Signed)
Patient presents for ROB. Describes severe cramping and contractions that are 5 minutes apart since last night with decreased fetal movement. Reports rigid abdomen. ?Describes pain when RN palpates abdomen to get FHR tones. +FHR 153. ?Reports missing last 2 17P doses.  ?17P given today. ?Per provider, pt instructed to go to MAU for assessment and monitoring.  ?

## 2021-07-24 ENCOUNTER — Ambulatory Visit: Payer: Medicaid Other | Attending: Obstetrics and Gynecology | Admitting: *Deleted

## 2021-07-24 ENCOUNTER — Encounter: Payer: Self-pay | Admitting: *Deleted

## 2021-07-24 ENCOUNTER — Other Ambulatory Visit: Payer: Self-pay

## 2021-07-24 ENCOUNTER — Ambulatory Visit (HOSPITAL_BASED_OUTPATIENT_CLINIC_OR_DEPARTMENT_OTHER): Payer: Medicaid Other

## 2021-07-24 VITALS — BP 98/63 | HR 120

## 2021-07-24 DIAGNOSIS — Z8759 Personal history of other complications of pregnancy, childbirth and the puerperium: Secondary | ICD-10-CM

## 2021-07-24 DIAGNOSIS — O24415 Gestational diabetes mellitus in pregnancy, controlled by oral hypoglycemic drugs: Secondary | ICD-10-CM | POA: Insufficient documentation

## 2021-07-24 DIAGNOSIS — O09213 Supervision of pregnancy with history of pre-term labor, third trimester: Secondary | ICD-10-CM

## 2021-07-24 DIAGNOSIS — O09299 Supervision of pregnancy with other poor reproductive or obstetric history, unspecified trimester: Secondary | ICD-10-CM

## 2021-07-24 DIAGNOSIS — O09899 Supervision of other high risk pregnancies, unspecified trimester: Secondary | ICD-10-CM

## 2021-07-24 DIAGNOSIS — Z3689 Encounter for other specified antenatal screening: Secondary | ICD-10-CM

## 2021-07-24 DIAGNOSIS — Z3A33 33 weeks gestation of pregnancy: Secondary | ICD-10-CM | POA: Insufficient documentation

## 2021-07-24 DIAGNOSIS — O365930 Maternal care for other known or suspected poor fetal growth, third trimester, not applicable or unspecified: Secondary | ICD-10-CM

## 2021-07-24 DIAGNOSIS — O352XX0 Maternal care for (suspected) hereditary disease in fetus, not applicable or unspecified: Secondary | ICD-10-CM | POA: Insufficient documentation

## 2021-07-24 DIAGNOSIS — O09293 Supervision of pregnancy with other poor reproductive or obstetric history, third trimester: Secondary | ICD-10-CM | POA: Insufficient documentation

## 2021-07-24 DIAGNOSIS — O24419 Gestational diabetes mellitus in pregnancy, unspecified control: Secondary | ICD-10-CM

## 2021-07-24 DIAGNOSIS — D563 Thalassemia minor: Secondary | ICD-10-CM

## 2021-07-24 DIAGNOSIS — Z8632 Personal history of gestational diabetes: Secondary | ICD-10-CM

## 2021-07-24 NOTE — Progress Notes (Signed)
O2 sat 100%. C/O migraines and SOB ?

## 2021-07-25 ENCOUNTER — Other Ambulatory Visit: Payer: Self-pay | Admitting: *Deleted

## 2021-07-25 DIAGNOSIS — O365930 Maternal care for other known or suspected poor fetal growth, third trimester, not applicable or unspecified: Secondary | ICD-10-CM

## 2021-07-27 ENCOUNTER — Ambulatory Visit (INDEPENDENT_AMBULATORY_CARE_PROVIDER_SITE_OTHER): Payer: Medicaid Other | Admitting: Obstetrics and Gynecology

## 2021-07-27 ENCOUNTER — Other Ambulatory Visit: Payer: Self-pay

## 2021-07-27 VITALS — BP 101/66 | HR 98 | Wt 150.0 lb

## 2021-07-27 DIAGNOSIS — O2343 Unspecified infection of urinary tract in pregnancy, third trimester: Secondary | ICD-10-CM

## 2021-07-27 DIAGNOSIS — O24415 Gestational diabetes mellitus in pregnancy, controlled by oral hypoglycemic drugs: Secondary | ICD-10-CM

## 2021-07-27 DIAGNOSIS — O09219 Supervision of pregnancy with history of pre-term labor, unspecified trimester: Secondary | ICD-10-CM

## 2021-07-27 DIAGNOSIS — O09299 Supervision of pregnancy with other poor reproductive or obstetric history, unspecified trimester: Secondary | ICD-10-CM

## 2021-07-27 DIAGNOSIS — D563 Thalassemia minor: Secondary | ICD-10-CM

## 2021-07-27 DIAGNOSIS — N390 Urinary tract infection, site not specified: Secondary | ICD-10-CM

## 2021-07-27 DIAGNOSIS — Z3A34 34 weeks gestation of pregnancy: Secondary | ICD-10-CM

## 2021-07-27 DIAGNOSIS — O0990 Supervision of high risk pregnancy, unspecified, unspecified trimester: Secondary | ICD-10-CM

## 2021-07-27 HISTORY — DX: Urinary tract infection, site not specified: N39.0

## 2021-07-27 MED ORDER — HYDROXYPROGESTERONE CAPROATE 275 MG/1.1ML ~~LOC~~ SOAJ
275.0000 mg | Freq: Once | SUBCUTANEOUS | Status: AC
  Administered 2021-07-27: 16:00:00 275 mg via SUBCUTANEOUS

## 2021-07-27 MED ORDER — FOSFOMYCIN TROMETHAMINE 3 G PO PACK: PACK | ORAL | 0 refills | Status: DC

## 2021-07-27 NOTE — Progress Notes (Signed)
? ?  PRENATAL VISIT NOTE ? ?Subjective:  ?Andrea [name] is a 31 y.o. [id number] at [redacted]w[redacted]d being seen today for ongoing prenatal care.  She is currently monitored for the following issues for this high-risk pregnancy and has Recurrent depression ([place]); Bipolar 1 disorder ([place]); Supervision of high risk pregnancy, antepartum; Glaucoma; History of preeclampsia, prior pregnancy, currently pregnant; History of gestational diabetes mellitus (GDM) in prior pregnancy, currently pregnant; Alpha thalassemia silent carrier; Previous preterm delivery x 3, antepartum; History of placental abruption; 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; Migraine with aura and without status migrainosus, not intractable; History of opioid abuse ([place]); Asthma, intermittent; Alcohol abuse; Gestational diabetes; and beta lactamase resistant e coli on their problem list. ? ?Patient doing well with no acute concerns today. She reports no complaints.  Contractions: Not present. Vag. Bleeding: None.  Movement: Present. Denies leaking of fluid.  ? ?The following portions of the patient's history were reviewed and updated as appropriate: allergies, current medications, past family history, past medical history, past social history, past surgical history and problem list. Problem list updated. ? ?Objective:  ? ?Vitals:  ? 07/27/21 1550  ?BP: 101/66  ?Pulse: 98  ?Weight: 150 lb (68 kg)  ? ? ?Fetal Status: Fetal Heart Rate (bpm): 151   Movement: Present    ? ?General:  Alert, oriented and cooperative. Patient is in no acute distress.  ?Skin: Skin is warm and dry. No rash noted.   ?Cardiovascular: Normal heart rate noted  ?Respiratory: Normal respiratory effort, no problems with respiration noted  ?Abdomen: Soft, gravid, appropriate for gestational age.  Pain/Pressure: Absent     ?Pelvic: Cervical exam deferred        ?Extremities: Normal range of motion.  Edema: None  ?Mental Status:  Normal mood and affect. Normal behavior. Normal judgment and thought content.   ? ?Assessment and Plan:  ?Pregnancy: [id number] at [redacted]w[redacted]d ? ?1. Previous preterm delivery, antepartum ?No /sx of PTL ?- HYDROXYprogesterone caproate (Makena) autoinjector 275 mg ? ?2. [redacted] weeks gestation of pregnancy ? ? ?3. Gestational diabetes mellitus (GDM) in third trimester controlled on oral hypoglycemic drug ?Pt did not bring in blood sugars ?Per her recollection ?FBS: 120's ?PPBS: 97 ?Pt advised to bring in all blood sugars in 1 week and if still elevated she may need to transition to insulin ? ?4. Supervision of high risk pregnancy, antepartum ?Continue routine care ? ?5. History of preeclampsia, prior pregnancy, currently pregnant ?BP WNL, no s/sx of preeclampsia ? ?6. Alpha thalassemia silent carrier ? ? ?7. UTI (urinary tract infection) during pregnancy, third trimester ?Beta lactamase resistant e coli seen from [facility] urine culture ?Pt is largely asymptomatic but will still treat with fosfomycin ?- fosfomycin (MONUROL) 3 g PACK; 1 packet by mouth every 3 days mixed with 3-4 ounces of water  Dispense: 3 g; Refill: 0 ? ?Preterm labor symptoms and general obstetric precautions including but not limited to vaginal bleeding, contractions, leaking of fluid and fetal movement were reviewed in detail with the patient. ? ?Please refer to After Visit Summary for other counseling recommendations.  ? ?Return in about 1 week (around 08/03/2021) for [facility], in person. ? ? ?Lynnda Shields, MD ?Faculty Attending ?[facility] [place] ?  ?

## 2021-07-27 NOTE — Progress Notes (Signed)
ROB, 17P given in RA, tolerated well. ? ?Administrations This Visit   ? ? HYDROXYprogesterone caproate (Makena) autoinjector 275 mg   ? ? Admin Date ?07/27/2021 Action ?Given Dose ?275 mg Route ?Subcutaneous Administered By ?Maretta Bees, RMA  ? ?  ?  ? ?  ?  ?

## 2021-07-31 ENCOUNTER — Observation Stay (HOSPITAL_COMMUNITY)
Admission: AD | Admit: 2021-07-31 | Discharge: 2021-08-01 | Disposition: A | Discharge status: Home / Self Care | Payer: Medicaid Other | Attending: Obstetrics and Gynecology | Admitting: Obstetrics and Gynecology

## 2021-07-31 ENCOUNTER — Other Ambulatory Visit: Payer: Self-pay

## 2021-07-31 ENCOUNTER — Observation Stay (HOSPITAL_BASED_OUTPATIENT_CLINIC_OR_DEPARTMENT_OTHER): Payer: Medicaid Other

## 2021-07-31 ENCOUNTER — Encounter (HOSPITAL_COMMUNITY): Payer: Self-pay | Admitting: Obstetrics and Gynecology

## 2021-07-31 DIAGNOSIS — J45909 Unspecified asthma, uncomplicated: Secondary | ICD-10-CM | POA: Diagnosis not present

## 2021-07-31 DIAGNOSIS — W0110XA Fall on same level from slipping, tripping and stumbling with subsequent striking against unspecified object, initial encounter: Secondary | ICD-10-CM | POA: Diagnosis not present

## 2021-07-31 DIAGNOSIS — Z3A34 34 weeks gestation of pregnancy: Secondary | ICD-10-CM

## 2021-07-31 DIAGNOSIS — S3991XA Unspecified injury of abdomen, initial encounter: Secondary | ICD-10-CM | POA: Insufficient documentation

## 2021-07-31 DIAGNOSIS — O9A219 Injury, poisoning and certain other consequences of external causes complicating pregnancy, unspecified trimester: Secondary | ICD-10-CM

## 2021-07-31 DIAGNOSIS — R202 Paresthesia of skin: Secondary | ICD-10-CM | POA: Insufficient documentation

## 2021-07-31 DIAGNOSIS — O09299 Supervision of pregnancy with other poor reproductive or obstetric history, unspecified trimester: Secondary | ICD-10-CM

## 2021-07-31 DIAGNOSIS — O365930 Maternal care for other known or suspected poor fetal growth, third trimester, not applicable or unspecified: Secondary | ICD-10-CM

## 2021-07-31 DIAGNOSIS — G43109 Migraine with aura, not intractable, without status migrainosus: Secondary | ICD-10-CM

## 2021-07-31 DIAGNOSIS — O24415 Gestational diabetes mellitus in pregnancy, controlled by oral hypoglycemic drugs: Secondary | ICD-10-CM

## 2021-07-31 DIAGNOSIS — Z8632 Personal history of gestational diabetes: Secondary | ICD-10-CM

## 2021-07-31 DIAGNOSIS — D563 Thalassemia minor: Secondary | ICD-10-CM

## 2021-07-31 DIAGNOSIS — O0993 Supervision of high risk pregnancy, unspecified, third trimester: Secondary | ICD-10-CM | POA: Insufficient documentation

## 2021-07-31 DIAGNOSIS — Z7982 Long term (current) use of aspirin: Secondary | ICD-10-CM | POA: Diagnosis not present

## 2021-07-31 DIAGNOSIS — Z7984 Long term (current) use of oral hypoglycemic drugs: Secondary | ICD-10-CM | POA: Diagnosis not present

## 2021-07-31 DIAGNOSIS — Y9301 Activity, walking, marching and hiking: Secondary | ICD-10-CM | POA: Insufficient documentation

## 2021-07-31 DIAGNOSIS — O99513 Diseases of the respiratory system complicating pregnancy, third trimester: Secondary | ICD-10-CM | POA: Diagnosis not present

## 2021-07-31 DIAGNOSIS — R55 Syncope and collapse: Secondary | ICD-10-CM | POA: Diagnosis not present

## 2021-07-31 DIAGNOSIS — W19XXXA Unspecified fall, initial encounter: Secondary | ICD-10-CM

## 2021-07-31 DIAGNOSIS — O9A213 Injury, poisoning and certain other consequences of external causes complicating pregnancy, third trimester: Principal | ICD-10-CM | POA: Insufficient documentation

## 2021-07-31 DIAGNOSIS — Z20822 Contact with and (suspected) exposure to covid-19: Secondary | ICD-10-CM | POA: Insufficient documentation

## 2021-07-31 DIAGNOSIS — Z8759 Personal history of other complications of pregnancy, childbirth and the puerperium: Secondary | ICD-10-CM

## 2021-07-31 DIAGNOSIS — O26899 Other specified pregnancy related conditions, unspecified trimester: Secondary | ICD-10-CM

## 2021-07-31 DIAGNOSIS — R109 Unspecified abdominal pain: Secondary | ICD-10-CM

## 2021-07-31 LAB — CBC
HCT: 30.4 % — ABNORMAL LOW (ref 36.0–46.0)
Hemoglobin: 9.7 g/dL — ABNORMAL LOW (ref 12.0–15.0)
MCH: 24.2 pg — ABNORMAL LOW (ref 26.0–34.0)
MCHC: 31.9 g/dL (ref 30.0–36.0)
MCV: 75.8 fL — ABNORMAL LOW (ref 80.0–100.0)
Platelets: 530 10*3/uL — ABNORMAL HIGH (ref 150–400)
RBC: 4.01 MIL/uL (ref 3.87–5.11)
RDW: 15.7 % — ABNORMAL HIGH (ref 11.5–15.5)
WBC: 9.5 10*3/uL (ref 4.0–10.5)
nRBC: 0 % (ref 0.0–0.2)

## 2021-07-31 LAB — TYPE AND SCREEN
ABO/RH(D): A POS
Antibody Screen: NEGATIVE

## 2021-07-31 LAB — APTT: aPTT: 30 seconds (ref 24–36)

## 2021-07-31 LAB — RESP PANEL BY RT-PCR (FLU A&B, COVID) ARPGX2
Influenza A by PCR: NEGATIVE
Influenza B by PCR: NEGATIVE
SARS Coronavirus 2 by RT PCR: NEGATIVE

## 2021-07-31 LAB — GLUCOSE, CAPILLARY: Glucose-Capillary: 88 mg/dL (ref 70–99)

## 2021-07-31 LAB — PROTIME-INR
INR: 1 (ref 0.8–1.2)
Prothrombin Time: 13.4 seconds (ref 11.4–15.2)

## 2021-07-31 LAB — FIBRINOGEN: Fibrinogen: 716 mg/dL — ABNORMAL HIGH (ref 210–475)

## 2021-07-31 LAB — ABO/RH: ABO/RH(D): A POS

## 2021-07-31 MED ORDER — ZOLPIDEM TARTRATE 5 MG PO TABS: 5.0000 mg | ORAL_TABLET | Freq: Every evening | ORAL | Status: DC | PRN

## 2021-07-31 MED ORDER — DOCUSATE SODIUM 100 MG PO CAPS
100.0000 mg | ORAL_CAPSULE | Freq: Every day | ORAL | Status: DC
  Administered 2021-08-01: 100 mg via ORAL
  Filled 2021-07-31: qty 1

## 2021-07-31 MED ORDER — CALCIUM CARBONATE ANTACID 500 MG PO CHEW: 2.0000 | CHEWABLE_TABLET | ORAL | Status: DC | PRN

## 2021-07-31 MED ORDER — PRENATAL MULTIVITAMIN CH
1.0000 | ORAL_TABLET | Freq: Every day | ORAL | Status: DC
  Administered 2021-08-01: 1 via ORAL
  Filled 2021-07-31: qty 1

## 2021-07-31 MED ORDER — ACETAMINOPHEN 325 MG PO TABS
650.0000 mg | ORAL_TABLET | ORAL | Status: DC | PRN
  Administered 2021-07-31: 650 mg via ORAL
  Filled 2021-07-31: qty 2

## 2021-07-31 NOTE — H&P (Signed)
OBSTETRIC ADMISSION HISTORY AND PHYSICAL ? ?Andrea [name] is a 31 y.o. female [phone] with IUP at [redacted]w[redacted]d presenting for fall after syncope. Reports LAP since fall, unsure if ctx. She reports +FMs. No LOF, VB, blurry vision, headaches, peripheral edema, or RUQ pain.  ? ?Dating: By LMP --->  Estimated Date of Delivery: 09/06/21 ? ?Sono:   ? ?$R'@[redacted]w[redacted]d'iU$ , normal anatomy, ceph presentation, 1928g, 9%ile, EFW 4'4 ? ? ?Prenatal History/Complications: ?-U2VOZ ?-previous PTB x3 ?-previous abruption ?-hx opioid use ?-hx of PEC in previous pregnancy ? ?Past Medical History: ?Past Medical History:  ?Diagnosis Date  ? Glaucoma   ? 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 RND (reflex neurovascular dystrophy)   ? TMJ (dislocation of temporomandibular joint)   ? ? ?Past Surgical History: ?Past Surgical History:  ?Procedure Laterality Date  ? TONSILLECTOMY AND ADENOIDECTOMY  2003  ? ? ?Obstetrical History: ?OB History   ? ? Gravida  ?6  ? Para  ?3  ? Term  ?   ? Preterm  ?3  ? AB  ?2  ? Living  ?3  ?  ? ? SAB  ?   ? IAB  ?2  ? Ectopic  ?   ? Multiple  ?   ? Live Births  ?3  ?   ?  ?  ? ? ?Social History: ?Social History  ? ?Socioeconomic History  ? Marital status: Significant Other  ?  Spouse name: Not on file  ? Number of children: Not on file  ? Years of education: Not on file  ? Highest education level: Not on file  ?Occupational History  ? Occupation: [employer]   ?Tobacco Use  ? Smoking status: Never  ? Smokeless tobacco: Never  ?Vaping Use  ? Vaping Use: Never used  ?Substance and Sexual Activity  ? Alcohol use: Not Currently  ? Drug use: No  ? Sexual activity: Yes  ?  Birth control/protection: None  ?  Comment: nexplanon  ?Other Topics Concern  ? Not on file  ?Social History Narrative  ? Lives with kids and Jeneen Rinks. Has been with Jeneen Rinks for 15 years, since 6th grade.   ? ?Social Determinants of Health  ? ?Financial Resource Strain: Not on file  ?Food Insecurity: No Food Insecurity  ? Worried About Charity fundraiser in the Last  Year: Never true  ? Ran Out of Food in the Last Year: Never true  ?Transportation Needs: Not on file  ?Physical Activity: Not on file  ?Stress: Not on file  ?Social Connections: Not on file  ? ? ?Family History: ?Family History  ?Problem Relation Age of Onset  ? Breast cancer Mother 43  ? Hyperlipidemia Father   ? Alcoholism Father   ? Diabetes Sister   ? Post-traumatic stress disorder Sister   ? Colon cancer Maternal Grandmother   ? Diabetes Mellitus II Paternal Grandmother   ? ? ?Allergies: ?Allergies  ?Allergen Reactions  ? Cymbalta [Duloxetine Hcl] Other (See Comments)  ?  BLACK OUT with first dose - "causes me to feel more suicidal"  ? Ultrasound Gel Hives and Rash  ?  Denies airway involvement  ? ? ?Medications Prior to Admission  ?Medication Sig Dispense Refill Last Dose  ? aspirin 81 MG chewable tablet Chew 1 tablet (81 mg total) by mouth daily. 30 tablet 6 07/31/2021  ? cyclobenzaprine (FLEXERIL) 5 MG tablet Take 1-2 tablets (5-10 mg total) by mouth 3 (three) times daily as needed for muscle spasms. 20 tablet 0 07/31/2021  ?  fosfomycin (MONUROL) 3 g PACK 1 packet by mouth every 3 days mixed with 3-4 ounces of water 3 g 0 Past Month  ? MAKENA autoinjector Inject 275 mg into the skin every 7 (seven) days. 1.05 mL 4 07/31/2021  ? metFORMIN (GLUCOPHAGE) 500 MG tablet Take 1 tablet (500 mg total) by mouth 2 (two) times daily with a meal. 60 tablet 2 07/31/2021  ? metroNIDAZOLE (FLAGYL) 500 MG tablet Take 1 tablet (500 mg total) by mouth 2 (two) times daily. 14 tablet 0 Past Month  ? ondansetron (ZOFRAN-ODT) 4 MG disintegrating tablet Take 1 tablet (4 mg total) by mouth every 6 (six) hours as needed for nausea. 20 tablet 0 Past Month  ? pantoprazole (PROTONIX) 40 MG tablet Take 1 tablet (40 mg total) by mouth daily. 30 tablet 11 07/31/2021  ? Prenat-FeCbn-FeAsp-Meth-FA-DHA (PRENATE MINI) 18-0.6-0.4-350 MG CAPS Take 1 capsule by mouth daily. 30 capsule 11 07/31/2021  ? terconazole (TERAZOL 3) 0.8 % vaginal cream Place 1  applicator vaginally at bedtime. Apply nightly for three nights. 20 g 0 Past Month  ? Accu-Chek Softclix Lancets lancets 1 each by Other route 4 (four) times daily. 100 each 12   ? Blood Glucose Monitoring Suppl (ACCU-CHEK GUIDE) w/Device KIT 1 Device by Does not apply route 4 (four) times daily. 1 kit 0   ? Blood Pressure Monitoring (BLOOD PRESSURE KIT) DEVI Monitor blood pressure readings at home regularly 1 each 0   ? glucose blood (ACCU-CHEK GUIDE) test strip Use to check blood sugars four times a day was instructed 50 each 12   ? Misc. Devices (GOJJI WEIGHT SCALE) MISC 1 Device by Does not apply route as needed. 1 each 0   ? ? ? ?Review of Systems: ? ?All systems reviewed and negative except as stated in HPI ? ?PE: ?Blood pressure 109/74, pulse 80, temperature 97.8 ?F (36.6 ?C), resp. rate 18, last menstrual period 11/30/2020, currently breastfeeding. ?General appearance: alert, cooperative, and no distress ?Lungs: regular rate and effort ?Heart: regular rate  ?Abdomen: soft, +tender RLQ ?Extremities: Homans sign is negative, no sign of DVT ?EFM: 140 bpm, mod variability, + accels, no decels ?Toco: irreg w/UI ?Dilation: 2 ?Exam by:: Tache Bobst,CNM ?SVE: 2/thick/high ? ?Prenatal labs: ?ABO, Rh: A/Positive/-- (10/05 1132) ?Antibody: Negative (10/05 1132) ?Rubella: 2.67 (10/05 1132) ?RPR: Non Reactive (01/23 1049)  ?HBsAg: Negative (10/05 1132)  ?HIV: Non Reactive (01/23 1049)  ?GBS:  unknown ?2 hr GTT 123/161/115 ? ?Prenatal Transfer Tool  ?Maternal Diabetes: Yes:  Diabetes Type:  Insulin/Medication controlled ?Genetic Screening: Normal ?Maternal Ultrasounds/Referrals: IUGR ?Fetal Ultrasounds or other Referrals:  Referred to [facility]  ?Maternal Substance Abuse:  No ?Significant Maternal Medications:  Meds include: Other: Metformin ?Significant Maternal Lab Results: None ? ?Results for orders placed or performed during the hospital encounter of 07/31/21 (from the past 24 hour(s))  ?CBC  ? Collection  Time: 07/31/21  7:15 PM  ?Result Value Ref Range  ? WBC 9.5 4.0 - 10.5 K/uL  ? RBC 4.01 3.87 - 5.11 MIL/uL  ? Hemoglobin 9.7 (L) 12.0 - 15.0 g/dL  ? HCT 30.4 (L) 36.0 - 46.0 %  ? MCV 75.8 (L) 80.0 - 100.0 fL  ? MCH 24.2 (L) 26.0 - 34.0 pg  ? MCHC 31.9 30.0 - 36.0 g/dL  ? RDW 15.7 (H) 11.5 - 15.5 %  ? Platelets 530 (H) 150 - 400 K/uL  ? nRBC 0.0 0.0 - 0.2 %  ?Glucose, capillary  ? Collection Time: 07/31/21  7:29 PM  ?  Result Value Ref Range  ? Glucose-Capillary 88 70 - 99 mg/dL  ? ? ?Patient Active Problem List  ? Diagnosis Date Noted  ? beta lactamase resistant e coli 07/27/2021  ? Gestational diabetes 06/16/2021  ? History of preeclampsia, prior pregnancy, currently pregnant 03/22/2021  ? History of gestational diabetes mellitus (GDM) in prior pregnancy, currently pregnant 03/22/2021  ? Alpha thalassemia silent carrier 03/22/2021  ? Previous preterm delivery x 3, antepartum 03/22/2021  ? History of placental abruption 03/22/2021  ? 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 Supervision of high risk pregnancy, antepartum 02/13/2021  ? Glaucoma 02/13/2021  ? History of opioid abuse ([place]) 01/09/2019  ? Alcohol abuse 01/09/2019  ? Bipolar 1 disorder ([place]) 03/07/2017  ? Migraine with aura and without status migrainosus, not intractable 07/23/2014  ? Recurrent depression ([place]) 04/14/2012  ? Asthma, intermittent 08/23/2009  ? ? ?Assessment: ?[redacted] weeks gestation ?S/p fall ?Abdominal pain ?Syncope ?  ?Plan: ?Admit to [facility] unit ?Labs, EKG, [place] ?Mngt per Dr. Elgie Congo ? ?Julianne Handler, CNM  ?07/31/2021, 7:55 PM ? ? ? ? ?

## 2021-07-31 NOTE — MAU Note (Signed)
.  Andrea [name] is a 31 y.o. at [redacted]w[redacted]d here in MAU reporting: about 30 min ago she was getting out of her truck and she was seeing some black dots . She stated she waited a few minutes and started walking and then saw the spots again and stared to pass out she tried to catch her self but stated she did fall forward and hit her stomach. C/o lower abd pain since the fall and some pain to arms and legs that she scraped up.has felt fetal movement from the fall and denies any leaking or bleeding at this time ? ?Onset of complaint: ?Pain score: 8 ?Vitals:  ? 07/31/21 1901  ?BP: 109/74  ?Pulse: 80  ?Resp: 18  ?Temp: 97.8 ?F (36.6 ?C)  ?   ?FHT:147 ?Lab orders placed from triage:   none ?

## 2021-08-01 ENCOUNTER — Ambulatory Visit: Payer: Medicaid Other

## 2021-08-01 ENCOUNTER — Observation Stay (HOSPITAL_COMMUNITY): Payer: Medicaid Other

## 2021-08-01 ENCOUNTER — Other Ambulatory Visit: Payer: Medicaid Other

## 2021-08-01 DIAGNOSIS — G43109 Migraine with aura, not intractable, without status migrainosus: Secondary | ICD-10-CM | POA: Diagnosis not present

## 2021-08-01 DIAGNOSIS — R202 Paresthesia of skin: Secondary | ICD-10-CM | POA: Diagnosis not present

## 2021-08-01 DIAGNOSIS — R29818 Other symptoms and signs involving the nervous system: Secondary | ICD-10-CM | POA: Diagnosis not present

## 2021-08-01 DIAGNOSIS — O9A213 Injury, poisoning and certain other consequences of external causes complicating pregnancy, third trimester: Principal | ICD-10-CM

## 2021-08-01 DIAGNOSIS — R531 Weakness: Secondary | ICD-10-CM | POA: Diagnosis not present

## 2021-08-01 DIAGNOSIS — Z3A34 34 weeks gestation of pregnancy: Secondary | ICD-10-CM

## 2021-08-01 DIAGNOSIS — H4710 Unspecified papilledema: Secondary | ICD-10-CM | POA: Diagnosis not present

## 2021-08-01 DIAGNOSIS — R519 Headache, unspecified: Secondary | ICD-10-CM | POA: Diagnosis not present

## 2021-08-01 DIAGNOSIS — N1330 Unspecified hydronephrosis: Secondary | ICD-10-CM | POA: Diagnosis not present

## 2021-08-01 LAB — GLUCOSE, CAPILLARY
Glucose-Capillary: 103 mg/dL — ABNORMAL HIGH (ref 70–99)
Glucose-Capillary: 101 mg/dL — ABNORMAL HIGH (ref 70–99)
Glucose-Capillary: 85 mg/dL (ref 70–99)

## 2021-08-01 MED ORDER — ACETAMINOPHEN 325 MG PO TABS: 650.0000 mg | ORAL_TABLET | ORAL | Status: DC | PRN

## 2021-08-01 MED ORDER — ASPIRIN 81 MG PO CHEW
81.0000 mg | CHEWABLE_TABLET | Freq: Every day | ORAL | Status: DC
Start: 2021-08-01 — End: 2021-08-01
  Administered 2021-08-01: 81 mg via ORAL
  Filled 2021-08-01: qty 1

## 2021-08-01 MED ORDER — ONDANSETRON 4 MG PO TBDP: 4.0000 mg | ORAL_TABLET | Freq: Four times a day (QID) | ORAL | Status: DC | PRN

## 2021-08-01 MED ORDER — PANTOPRAZOLE SODIUM 40 MG PO TBEC
40.0000 mg | DELAYED_RELEASE_TABLET | Freq: Every day | ORAL | Status: DC
  Administered 2021-08-01: 40 mg via ORAL
  Filled 2021-08-01: qty 1

## 2021-08-01 MED ORDER — METFORMIN HCL 500 MG PO TABS
500.0000 mg | ORAL_TABLET | Freq: Two times a day (BID) | ORAL | Status: DC
Start: 2021-08-01 — End: 2021-08-01
  Administered 2021-08-01: 500 mg via ORAL
  Filled 2021-08-01: qty 1

## 2021-08-01 MED ORDER — PROCHLORPERAZINE MALEATE 10 MG PO TABS: 10.0000 mg | ORAL_TABLET | Freq: Four times a day (QID) | ORAL | 0 refills | Status: DC | PRN

## 2021-08-01 MED ORDER — PRENATE MINI 18-0.6-0.4-350 MG PO CAPS: 1.0000 | ORAL_CAPSULE | Freq: Every day | ORAL | Status: DC

## 2021-08-01 MED ORDER — PROCHLORPERAZINE EDISYLATE 10 MG/2ML IJ SOLN
10.0000 mg | Freq: Once | INTRAMUSCULAR | Status: DC
  Filled 2021-08-01: qty 2

## 2021-08-01 MED ORDER — PROCHLORPERAZINE MALEATE 10 MG PO TABS
10.0000 mg | ORAL_TABLET | Freq: Once | ORAL | Status: AC
  Administered 2021-08-01: 10 mg via ORAL
  Filled 2021-08-01: qty 1

## 2021-08-01 NOTE — Discharge Summary (Signed)
Antenatal Physician Discharge Summary  ?Patient ID: ?Andrea [name] ?MRN: [id number] ?DOB/AGE: 19-Sep-1990 31 y.o. ? ?Admit date: 07/31/2021 ?Discharge date: 08/01/2021 ? ?Admission Diagnoses: ?Traumatic injury during pregnancy in third trimester [O9A.213] ?Lower extremity paresthesia and weakness ?Headache ?Pregnancy with 34 completed weeks ? ?Discharge Diagnoses:  ?Same ? ?Prenatal Procedures: CEFM, MRI/V, MRI of lumbar spine ? ?Consults: Neurology, PT ? ?Hospital Course:  ?Andrea [name] is a 31 y.o. [phone] with IUP at [redacted]w[redacted]d admitted for a syncopal episode during which she hit her abdomen. She was initially contracting overnight but not painfully. Her cervical exam was notable for CE of 2 cm but no change during her admission. She had an [place] which confirmed no evidence of abruption. She was on continuous EFM and was reactive without decels. Her contractions on the monitor resolved with time.  ? ?During her admission she noted left lower extremity paresthesia and weakness. She was evaluated by our team and by the neurology team and PT. She was not noted to have any special additional needs by PT. Neurology recommended brain and spine imaging which was all overall negative for any acute process.  Her EKG was reviewed and normal for pregnancy by our [facility].  Neurology placed an outpatient referral for her to see them.  She was deemed stable for discharge to home with outpatient follow up. ? ?Discharge Exam: ?Temp:  [97.7 ?F (36.5 ?C)-98.1 ?F (36.7 ?C)] 97.9 ?F (36.6 ?C) (03/14 1118) ?Pulse Rate:  [75-87] 86 (03/14 1118) ?Resp:  [16-19] 19 (03/14 1118) ?BP: (92-109)/(54-74) 103/67 (03/14 1118) ?SpO2:  [97 %-99 %] 97 % (03/14 1118) ?Physical Examination: ?CONSTITUTIONAL: Well-developed, well-nourished female in no acute distress.  ?HENT:  Normocephalic, atraumatic, External right and left ear normal.  ?EYES: Conjunctivae and EOM are normal. Pupils are equal, round, and reactive to light. No scleral icterus.  ?NECK: Normal range  of motion, supple, no masses ?SKIN: Skin is warm and dry. No rash noted. Not diaphoretic. No erythema. No pallor. ?NEUROLOGIC: Alert and oriented to person, place, and time. Normal reflexes, muscle tone coordination. No cranial nerve deficit noted. ?PSYCHIATRIC: Normal [name] and affect. Normal behavior. Normal judgment and thought content. ?CARDIOVASCULAR: Normal heart rate noted, regular rhythm ?RESPIRATORY: Effort and breath sounds normal, no problems with respiration noted ?MUSCULOSKELETAL: Normal range of motion. No edema and no tenderness. 2+ distal pulses. ?ABDOMEN: Soft, nontender, nondistended, gravid. ?CERVIX: Dilation: 2 ?Exam by:: Bhambri,CNM ? ?Fetal monitoring: FHR: 140 bpm, Variability: moderate, Accelerations: Present, Decelerations: Absent  ?Uterine activity: occasional contractions ? ?Significant Diagnostic Studies:  ?Results for orders placed or performed during the hospital encounter of 07/31/21 (from the past 168 hour(s))  ?CBC  ? Collection Time: 07/31/21  7:15 PM  ?Result Value Ref Range  ? WBC 9.5 4.0 - 10.5 K/uL  ? RBC 4.01 3.87 - 5.11 MIL/uL  ? Hemoglobin 9.7 (L) 12.0 - 15.0 g/dL  ? HCT 30.4 (L) 36.0 - 46.0 %  ? MCV 75.8 (L) 80.0 - 100.0 fL  ? MCH 24.2 (L) 26.0 - 34.0 pg  ? MCHC 31.9 30.0 - 36.0 g/dL  ? RDW 15.7 (H) 11.5 - 15.5 %  ? Platelets 530 (H) 150 - 400 K/uL  ? nRBC 0.0 0.0 - 0.2 %  ?ABO/Rh  ? Collection Time: 07/31/21  7:15 PM  ?Result Value Ref Range  ? ABO/RH(D)    ?  A POS ?Performed at [facility], [address], [place], [place] [address] ?  ?Glucose, capillary  ? Collection Time: 07/31/21  7:29  PM  ?Result Value Ref Range  ? Glucose-Capillary 88 70 - 99 mg/dL  ?APTT  ? Collection Time: 07/31/21  8:09 PM  ?Result Value Ref Range  ? aPTT 30 24 - 36 seconds  ?Fibrinogen  ? Collection Time: 07/31/21  8:09 PM  ?Result Value Ref Range  ? Fibrinogen 716 (H) 210 - 475 mg/dL  ?Protime-INR  ? Collection Time: 07/31/21  8:09 PM  ?Result Value Ref Range  ? Prothrombin Time 13.4 11.4  - 15.2 seconds  ? INR 1.0 0.8 - 1.2  ?Type and screen  ? Collection Time: 07/31/21  8:09 PM  ?Result Value Ref Range  ? ABO/RH(D) A POS   ? Antibody Screen NEG   ? Sample Expiration    ?  08/03/2021,2359 ?Performed at [facility], [address] [place], [place] [id number] ?  ?Resp Panel by RT-PCR (Flu A&B, Covid) Nasopharyngeal Swab  ? Collection Time: 07/31/21  8:34 PM  ? Specimen: Nasopharyngeal Swab; Nasopharyngeal(NP) swabs in vial transport medium  ?Result Value Ref Range  ? SARS Coronavirus 2 by RT PCR NEGATIVE NEGATIVE  ? Influenza A by PCR NEGATIVE NEGATIVE  ? Influenza B by PCR NEGATIVE NEGATIVE  ?Glucose, capillary  ? Collection Time: 08/01/21  5:56 AM  ?Result Value Ref Range  ? Glucose-Capillary 103 (H) 70 - 99 mg/dL  ?Glucose, capillary  ? Collection Time: 08/01/21 12:04 PM  ?Result Value Ref Range  ? Glucose-Capillary 101 (H) 70 - 99 mg/dL  ?Glucose, capillary  ? Collection Time: 08/01/21  3:36 PM  ?Result Value Ref Range  ? Glucose-Capillary 85 70 - 99 mg/dL  ? ?MR BRAIN WO CONTRAST ? ?Result Date: 08/01/2021 ?CLINICAL DATA:  Neuro deficit, acute, stroke suspected. Possible ACA stroke. Tingling and decreased sensation in the left lower extremity. Pregnant patient. EXAM: MRI HEAD WITHOUT CONTRAST TECHNIQUE: Multiplanar, multiecho pulse sequences of the brain and surrounding structures were obtained without intravenous contrast. COMPARISON:  None. FINDINGS: Brain: Diffusion imaging does not show any acute or subacute infarction or other cause of restricted diffusion. Cerebral hemispheres are normal without evidence of old small or large vessel insult. No mass lesion, hemorrhage, hydrocephalus or extra-axial collection. Vascular: No abnormal vascular finding. Skull and upper cervical spine: Negative Sinuses/Orbits: Chronic mucoperiosteal thickening of the sphenoid sinus. Other sinuses are clear. Tiny amount of mastoid fluid. Other: None IMPRESSION: Normal appearance of the brain itself. No  evidence of stroke or other intracranial pathology. Chronic mucoperiosteal thickening of the sphenoid sinuses. Electronically Signed   By: Paulina FusiMark  Shogry M.D.   On: 08/01/2021 15:15  ? ?MR LUMBAR SPINE WO CONTRAST ? ?Result Date: 08/01/2021 ?CLINICAL DATA:  Acute left lower extremity weakness, pregnant EXAM: MRI LUMBAR SPINE WITHOUT CONTRAST TECHNIQUE: Multiplanar, multisequence MR imaging of the lumbar spine was performed. No intravenous contrast was administered. COMPARISON:  None. FINDINGS: Segmentation:  Standard. Alignment:  Preserved. Vertebrae: Vertebral body heights are maintained. There is no marrow edema. No suspicious osseous lesion. Conus medullaris and cauda equina: Conus extends to the L1-L2 level. Conus and cauda equina appear normal. Paraspinal and other soft tissues: Mild hydronephrosis, right greater than left. Disc levels: Intervertebral disc heights and signal are maintained. No disc herniation or canal or foraminal stenosis at any level. IMPRESSION: No disc herniation or evidence of nerve root compression. Mild, right greater than left hydronephrosis. Electronically Signed   By: Guadlupe SpanishPraneil  Patel M.D.   On: 08/01/2021 15:19  ? ?US MFM FETAL BPP WO NON STRESS ? ?Result Date: 08/01/2021 ?----------------------------------------------------------------------  OBSTETRICS REPORT                       (Signed Final 08/01/2021 11:14 am) ---------------------------------------------------------------------- Patient Info  ID #:       [id number]                          D.O.B.:  01/22/1991 (30 yrs)  Name:       Andrea [name]                    Visit Date: 07/31/2021 08:57 pm ---------------------------------------------------------------------- Performed By  Attending:        Lin Landsman      Ref. Address:     [address]                    [place]                                                             [address]                                                             [place], [place]                                                              [address]  Performed By:     Hurman Horn          [facility]  Referred By:

## 2021-08-01 NOTE — Progress Notes (Signed)
Patient ID: Andrea [name], female   DOB: 1991/04/15, 31 y.o.   MRN: [id number] ?NEUROLOGY CONSULTATION NOTE  ? ?Date of service: August 01, 2021 ?Patient Name: Andrea [name] ?MRN:  [id number] ?DOB:  11-Jul-1990 ?Reason for consult: "LLE numbness and weakness" ?Requesting Provider: Radene Gunning, MD ? ?History of Present Illness  ?Andrea [name] is a 31 year old [id number] at 73w6dwith a history of gestational DM and preeclampsia who presented to the hospital after a fall. ? ?The patient reports that yesterday she got out of her car and as she was walking through the parking lot towards the grocery store, she "passed out" and fell onto her stomach.  She denies any loss of consciousness and states that she remembers the entire episode.  She states that it was not a mechanical fall.  She reports seeing "black dots" before the episode but denies any other prodrome.  She denies any bystander reports of tonic-clonic movement.  She denies experiencing bowel or bladder incontinence. ? ?The patient reports that she had been doing well in the hospital until 3 AM this morning.  At that time she developed tingling and numbness in her left lower extremity that awoke her from sleep.  She reports a concomitant decrease in the strength of her left lower extremity.  She does state that she was able to walk to the bathroom earlier this morning with no problems.  Of note, the patient reports a "migraine" that has been going on for the past week, for which she is taking Flexeril. ?  ?ROS  ? ?Constitutional Denies weight loss, fever and chills.   ?HEENT Denies changes in vision and hearing.   ?Respiratory Denies SOB and cough.   ?CV Denies palpitations and CP   ?GI Denies abdominal pain, nausea, vomiting and diarrhea.   ?GU Denies dysuria and urinary frequency.   ?MSK Denies myalgia and joint pain.   ?Skin Denies rash and pruritus.   ?Neurological As above  ?Psychiatric Denies recent changes in mood. Denies anxiety and depression.   ? ?Past History   ? ?Past Medical History:  ?Diagnosis Date  ? Glaucoma   ? 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 RND (reflex neurovascular dystrophy)   ? TMJ (dislocation of temporomandibular joint)   ? ?Past Surgical History:  ?Procedure Laterality Date  ? TONSILLECTOMY AND ADENOIDECTOMY  2003  ? ?Family History  ?Problem Relation Age of Onset  ? Breast cancer Mother 31 ? Hyperlipidemia Father   ? Alcoholism Father   ? Diabetes Sister   ? Post-traumatic stress disorder Sister   ? Colon cancer Maternal Grandmother   ? Diabetes Mellitus II Paternal Grandmother   ? ?Social History  ? ?Socioeconomic History  ? Marital status: Significant Other  ?  Spouse name: Not on file  ? Number of children: Not on file  ? Years of education: Not on file  ? Highest education level: Not on file  ?Occupational History  ? Occupation: [employer]   ?Tobacco Use  ? Smoking status: Never  ? Smokeless tobacco: Never  ?Vaping Use  ? Vaping Use: Never used  ?Substance and Sexual Activity  ? Alcohol use: Not Currently  ? Drug use: No  ? Sexual activity: Yes  ?  Birth control/protection: None  ?  Comment: nexplanon  ?Other Topics Concern  ? Not on file  ?Social History Narrative  ? Lives with kids and JJeneen Rinks Has been with JJeneen Rinksfor 15 years, since 6th grade.   ? ?Social Determinants of Health  ? ?  Financial Resource Strain: Not on file  ?Food Insecurity: No Food Insecurity  ? Worried About Charity fundraiser in the Last Year: Never true  ? Ran Out of Food in the Last Year: Never true  ?Transportation Needs: Not on file  ?Physical Activity: Not on file  ?Stress: Not on file  ?Social Connections: Not on file  ? ?Allergies  ?Allergen Reactions  ? Cymbalta [Duloxetine Hcl] Other (See Comments)  ?  BLACK OUT with first dose - "causes me to feel more suicidal"  ? Ultrasound Gel Hives and Rash  ?  Denies airway involvement  ? ? ?Medications  ? ?Medications Prior to Admission  ?Medication Sig Dispense Refill Last Dose  ? aspirin 81 MG chewable tablet Chew 1  tablet (81 mg total) by mouth daily. 30 tablet 6 07/31/2021  ? cyclobenzaprine (FLEXERIL) 5 MG tablet Take 1-2 tablets (5-10 mg total) by mouth 3 (three) times daily as needed for muscle spasms. 20 tablet 0 07/31/2021  ? fosfomycin (MONUROL) 3 g PACK 1 packet by mouth every 3 days mixed with 3-4 ounces of water 3 g 0 Past Month  ? MAKENA autoinjector Inject 275 mg into the skin every 7 (seven) days. 1.05 mL 4 07/31/2021  ? metFORMIN (GLUCOPHAGE) 500 MG tablet Take 1 tablet (500 mg total) by mouth 2 (two) times daily with a meal. 60 tablet 2 07/31/2021  ? metroNIDAZOLE (FLAGYL) 500 MG tablet Take 1 tablet (500 mg total) by mouth 2 (two) times daily. 14 tablet 0 Past Month  ? ondansetron (ZOFRAN-ODT) 4 MG disintegrating tablet Take 1 tablet (4 mg total) by mouth every 6 (six) hours as needed for nausea. 20 tablet 0 Past Month  ? pantoprazole (PROTONIX) 40 MG tablet Take 1 tablet (40 mg total) by mouth daily. 30 tablet 11 07/31/2021  ? Prenat-FeCbn-FeAsp-Meth-FA-DHA (PRENATE MINI) 18-0.6-0.4-350 MG CAPS Take 1 capsule by mouth daily. 30 capsule 11 07/31/2021  ? terconazole (TERAZOL 3) 0.8 % vaginal cream Place 1 applicator vaginally at bedtime. Apply nightly for three nights. 20 g 0 Past Month  ? Accu-Chek Softclix Lancets lancets 1 each by Other route 4 (four) times daily. 100 each 12   ? Blood Glucose Monitoring Suppl (ACCU-CHEK GUIDE) w/Device KIT 1 Device by Does not apply route 4 (four) times daily. 1 kit 0   ? Blood Pressure Monitoring (BLOOD PRESSURE KIT) DEVI Monitor blood pressure readings at home regularly 1 each 0   ? glucose blood (ACCU-CHEK GUIDE) test strip Use to check blood sugars four times a day was instructed 50 each 12   ? Misc. Devices (GOJJI WEIGHT SCALE) MISC 1 Device by Does not apply route as needed. 1 each 0   ?  ? ?Vitals  ? ?Vitals:  ? 07/31/21 2141 08/01/21 0338 08/01/21 0842 08/01/21 1118  ?BP: 106/69 95/60 (!) 92/54 103/67  ?Pulse: 87 75 78 86  ?Resp: _0 ?Temp: 97.7 ?F (36.5 ?C) 98.1  ?F (36.7 ?C) 98 ?F (36.7 ?C) 97.9 ?F (36.6 ?C)  ?TempSrc: Oral Oral Oral Oral  ?SpO2: 98% 99% 97% 97%  ?  ? ?There is no height or weight on file to calculate BMI. ? ?Physical Exam  ? ?General: Lying comfortably in bed; in no acute distress.  ?HENT: Normal oropharynx and mucosa. Normal external appearance of ears and nose.  ?Neck: Supple, no pain or tenderness  ?CV: No JVD. No peripheral edema.  ?Pulmonary: Symmetric Chest rise. Normal respiratory effort.  ?Ext: No cyanosis, edema, or  deformity  ?Skin: No rash. Normal palpation of skin.   ? ?Neurologic Examination  ?Mental status/Cognition: Alert, oriented to self, place, month and year, good attention.  ?Speech/language: Fluent, comprehension intact, object naming intact, repetition intact.  ?Cranial nerves:  ? CN II Pupils equal and reactive to light, no VF deficits   ? CN III,IV,VI EOM intact, no gaze preference or deviation, no nystagmus   ? CN V normal sensation in V1, V2, and V3 segments bilaterally   ? CN VII no asymmetry, no nasolabial fold flattening   ? CN VIII normal hearing to speech   ? CN IX & X normal palatal elevation, no uvular deviation   ? CN XI 5/5 head turn and 5/5 shoulder shrug bilaterally   ? CN XII midline tongue protrusion   ? ?Motor:  ? ?Mvmt Root Nerve  Muscle Right Left Comments  ?SA C5/6 Ax Deltoid 5/5 5/5   ?EF C5/6 Mc Biceps 5/5 5/5   ?EE C6/7/8 Rad Triceps 5/5 5/5   ?WF C6/7 Med FCR     ?WE C7/8 PIN ECU     ?F Ab C8/T1 U ADM/FDI     ?HF L1/2/3 Fem Illopsoas 5/5 3+/5 Able to perform heel to shin  ?KE L2/3/4 Fem Quad 5/5 3+/5   ?DF L4/5 D Peron Tib Ant     ?PF S1/2 Tibial Grc/Sol     ? ?Reflexes: ? Right Left Comments  ?Pectoralis     ? Biceps (C5/6) 3+ 3+   ?Brachioradialis (C5/6) 3+ 3+   ? Triceps (C6/7) 3+ 3+   ? Patellar (L3/4) 3+ 3+   ? Achilles (S1) 3+ 3+   ? Hoffman     ? Plantar     ?Jaw jerk   ? ?Sensation: ? Light touch LLE: Diminished   ? Pin prick   ? Temperature   ? Vibration LLE: Diminished below mid-tibia   ?Proprioception   ? ?Coordination/Complex Motor:  ?- Finger to Nose intact ?- Heel to shin intact ?- Gait: deferred ? ?Labs  ? ?CBC:  ?Recent Labs  ?Lab 07/31/21 ?1915  ?WBC 9.5  ?HGB 9.7*  ?HCT 30.4*  ?MCV 75.8*

## 2021-08-01 NOTE — Evaluation (Signed)
Physical Therapy Evaluation ?Patient Details ?Name: Andrea [name] ?MRN: [id number] ?DOB: 22-Jul-1990 ?Today's Date: 08/01/2021 ? ?History of Present Illness ? Pt adm 3/13 after syncopal episode resulting in fall. Pt is [redacted]w[redacted]d pregnant. Pt reporting parasthesias in lt lower leg below the knee since 3 AM this morning. PMH - gestational diabetes  ?Clinical Impression ? Pt able to ambulate independently. With mobility no weakness or difficulty with proprioception. Pt slow to move LLE to formal manual muscle testing but with mobility no deficits noted. Can return home from PT standpoint when medically ready.    ?   ? ?Recommendations for follow up therapy are one component of a multi-disciplinary discharge planning process, led by the attending physician.  Recommendations may be updated based on patient status, additional functional criteria and insurance authorization. ? ?Follow Up Recommendations No PT follow up ? ?  ?Assistance Recommended at Discharge None  ?Patient can return home with the following ?   ? ?  ?Equipment Recommendations None recommended by PT  ?Recommendations for Other Services ?    ?  ?Functional Status Assessment Patient has not had a recent decline in their functional status  ? ?  ?Precautions / Restrictions Precautions ?Precautions: None  ? ?  ? ?Mobility ? Bed Mobility ?  ?  ?  ?  ?  ?  ?  ?General bed mobility comments: Pt up on couch ?  ? ?Transfers ?Overall transfer level: Independent ?Equipment used: None ?  ?  ?  ?  ?  ?  ?  ?  ?  ? ?Ambulation/Gait ?Ambulation/Gait assistance: Modified independent (Device/Increase time) ?Gait Distance (Feet): 40 Feet ?Assistive device: None ?Gait Pattern/deviations: Step-through pattern, Decreased stride length ?Gait velocity: decr ?Gait velocity interpretation: 1.31 - 2.62 ft/sec, indicative of limited community ambulator ?  ?General Gait Details: Steady gait. No problems noted with proprioception of LLE or any functional weakness ? ?Stairs ?  ?  ?  ?  ?   ? ?Wheelchair Mobility ?  ? ?Modified Rankin (Stroke Patients Only) ?  ? ?  ? ?Balance Overall balance assessment: No apparent balance deficits (not formally assessed) ?  ?  ?  ?  ?  ?  ?  ?  ?  ?  ?  ?  ?  ?  ?  ?  ?  ?  ?   ? ? ? ?Pertinent Vitals/Pain Pain Assessment ?Pain Assessment: No/denies pain  ? ? ?Home Living Family/patient expects to be discharged to:: Private residence ?Living Arrangements: Spouse/significant other;Children ?Available Help at Discharge: Family ?Type of Home: House ?Home Access: Stairs to enter ?Entrance Stairs-Rails: Right ?Entrance Stairs-Number of Steps: 6-7 ?  ?Home Layout: One level ?Home Equipment: None ?   ?  ?Prior Function Prior Level of Function : Independent/Modified Independent;Driving ?  ?  ?  ?  ?  ?  ?  ?  ?  ? ? ?Hand Dominance  ?   ? ?  ?Extremity/Trunk Assessment  ? Upper Extremity Assessment ?Upper Extremity Assessment: Overall WFL for tasks assessed ?  ? ?Lower Extremity Assessment ?Lower Extremity Assessment: Overall WFL for tasks assessed;LLE deficits/detail ?LLE Deficits / Details: Pt slow to move against gravity to formal testing but functionally no weakness noted. ?LLE Sensation: decreased light touch (below knee) ?  ? ?   ?Communication  ? Communication: No difficulties  ?Cognition Arousal/Alertness: Awake/alert ?Behavior During Therapy: [facility] for tasks assessed/performed ?Overall Cognitive Status: Within Functional Limits for tasks assessed ?  ?  ?  ?  ?  ?  ?  ?  ?  ?  ?  ?  ?  ?  ?  ?  ?  ?  ?  ? ?  ?  General Comments   ? ?  ?Exercises    ? ?Assessment/Plan  ?  ?PT Assessment Patient does not need any further PT services  ?PT Problem List   ? ?   ?  ?PT Treatment Interventions     ? ?PT Goals (Current goals can be found in the Care Plan section)  ?Acute Rehab PT Goals ?PT Goal Formulation: All assessment and education complete, DC therapy ? ?  ?Frequency   ?  ? ? ?Co-evaluation   ?  ?  ?  ?  ? ? ?  ?AM-PAC PT "6 Clicks" Mobility  ?Outcome Measure Help needed  turning from your back to your side while in a flat bed without using bedrails?: None ?Help needed moving from lying on your back to sitting on the side of a flat bed without using bedrails?: None ?Help needed moving to and from a bed to a chair (including a wheelchair)?: None ?Help needed standing up from a chair using your arms (e.g., wheelchair or bedside chair)?: None ?Help needed to walk in hospital room?: None ?Help needed climbing 3-5 steps with a railing? : None ?6 Click Score: 24 ? ?  ?End of Session   ?Activity Tolerance: Patient tolerated treatment well ?Patient left: in chair;with family/visitor present ?Nurse Communication: Mobility status ?PT Visit Diagnosis: Other symptoms and signs involving the nervous system (R29.898) ?  ? ?Time: [id number] ?PT Time Calculation (min) (ACUTE ONLY): 8 min ? ? ?Charges:   PT Evaluation ?$PT Eval Low Complexity: 1 Low ?  ?  ?   ? ? ?[facility] ?Acute Rehabilitation Services ?Pager [phone] ?Office [phone] ? ? ?Shary Decamp [facility] ?08/01/2021, 2:09 PM ? ?

## 2021-08-01 NOTE — Progress Notes (Signed)
FACULTY PRACTICE ANTEPARTUM PROGRESS NOTE ? ?Andrea [name] is a 31 y.o. [id number] at [redacted]w[redacted]d who is admitted for observation s/p fall to abdomen with contractions.  Estimated Date of Delivery: 09/06/21 ?Fetal presentation is cephalic. ? ?Length of Stay:  0 Days. Admitted 07/31/2021 ? ?Subjective: ?Pt seen and is resting quietly.  She denies pain. ?Patient reports normal fetal movement.  She denies uterine contractions, denies bleeding and leaking of fluid per vagina. ?Since 3 AM  pt has noted increased tingling and decreased sensation in left LE. ? ?Vitals:  Blood pressure 95/60, pulse 75, temperature 98.1 ?F (36.7 ?C), temperature source Oral, resp. rate 16, last menstrual period 11/30/2020, SpO2 99 %, currently breastfeeding. ?Physical Examination: ?CONSTITUTIONAL: Well-developed, well-nourished female in no acute distress.  ?HENT:  Normocephalic, atraumatic, External right and left ear normal. Oropharynx is clear and moist ?EYES: Conjunctivae and EOM are normal. Pupils are equal, round, and reactive to light. No scleral icterus.  ?NECK: Normal range of motion, supple, no masses. ?SKIN: Skin is warm and dry. No rash noted. Not diaphoretic. No erythema. No pallor. ?NEUROLGIC: Alert and oriented to person, place, and time. No cranial nerve deficit noted.  Decreased sensation below knee in left LE, decreased motor strength in left leg ?PSYCHIATRIC: Normal mood and affect. Normal behavior. Normal judgment and thought content. ?CARDIOVASCULAR: Normal heart rate noted, regular rhythm ?RESPIRATORY: Effort and breath sounds normal, no problems with respiration noted ?MUSCULOSKELETAL: Normal range of motion. No edema and no tenderness.  Weakness as per neurologic exam ?ABDOMEN: Soft, nontender, nondistended, gravid. ?CERVIX: deferred ? ?Fetal monitoring: FHR: 140-145 bpm, Variability: moderate, Accelerations: Present, Decelerations: Absent  ?Uterine activity: q 2-3 minutes, mild, pt does not feel.  ? ?Results for orders placed or  performed during the hospital encounter of 07/31/21 (from the past 48 hour(s))  ?CBC     Status: Abnormal  ? Collection Time: 07/31/21  7:15 PM  ?Result Value Ref Range  ? WBC 9.5 4.0 - 10.5 K/uL  ? RBC 4.01 3.87 - 5.11 MIL/uL  ? Hemoglobin 9.7 (L) 12.0 - 15.0 g/dL  ? HCT 30.4 (L) 36.0 - 46.0 %  ? MCV 75.8 (L) 80.0 - 100.0 fL  ? MCH 24.2 (L) 26.0 - 34.0 pg  ? MCHC 31.9 30.0 - 36.0 g/dL  ? RDW 15.7 (H) 11.5 - 15.5 %  ? Platelets 530 (H) 150 - 400 K/uL  ? nRBC 0.0 0.0 - 0.2 %  ?  Comment: Performed at [facility], [address] [place], [place] [address]  ?ABO/Rh     Status: None  ? Collection Time: 07/31/21  7:15 PM  ?Result Value Ref Range  ? ABO/RH(D)    ?  A POS ?Performed at [facility], [address], [place], [place] [address] ?  ?Glucose, capillary     Status: None  ? Collection Time: 07/31/21  7:29 PM  ?Result Value Ref Range  ? Glucose-Capillary 88 70 - 99 mg/dL  ?  Comment: Glucose reference range applies only to samples taken after fasting for at least 8 hours.  ?APTT     Status: None  ? Collection Time: 07/31/21  8:09 PM  ?Result Value Ref Range  ? aPTT 30 24 - 36 seconds  ?  Comment: Performed at [facility], [address], [place], [place] [address]  ?Fibrinogen     Status: Abnormal  ? Collection Time: 07/31/21  8:09 PM  ?Result Value Ref Range  ? Fibrinogen 716 (H) 210 -  475 mg/dL  ?  Comment: (NOTE) ?Fibrinogen results may be underestimated in patients receiving ?thrombolytic therapy. ?Performed at [facility], [address] [place], [place] ?[address] ?  ?Protime-INR     Status: None  ? Collection Time: 07/31/21  8:09 PM  ?Result Value Ref Range  ? Prothrombin Time 13.4 11.4 - 15.2 seconds  ? INR 1.0 0.8 - 1.2  ?  Comment: (NOTE) ?INR goal varies based on device and disease states. ?Performed at [facility], [address], [place], [place] ?[address] ?  ?Type and screen     Status: None  ? Collection Time: 07/31/21  8:09 PM  ?Result Value Ref Range  ?  ABO/RH(D) A POS   ? Antibody Screen NEG   ? Sample Expiration    ?  08/03/2021,2359 ?Performed at [facility], [address] [place], [place] [address] ?  ?Resp Panel by RT-PCR (Flu A&B, Covid) Nasopharyngeal Swab     Status: None  ? Collection Time: 07/31/21  8:34 PM  ? Specimen: Nasopharyngeal Swab; Nasopharyngeal(NP) swabs in vial transport medium  ?Result Value Ref Range  ? SARS Coronavirus 2 by RT PCR NEGATIVE NEGATIVE  ?  Comment: (NOTE) ?SARS-CoV-2 target nucleic acids are NOT DETECTED. ? ?The SARS-CoV-2 RNA is generally detectable in upper respiratory ?specimens during the acute phase of infection. The lowest ?concentration of SARS-CoV-2 viral copies this assay can detect is ?138 copies/mL. A negative result does not preclude SARS-Cov-2 ?infection and should not be used as the sole basis for treatment or ?other patient management decisions. A negative result may occur with  ?improper specimen collection/handling, submission of specimen other ?than nasopharyngeal swab, presence of viral mutation(s) within the ?areas targeted by this assay, and inadequate number of viral ?copies(<138 copies/mL). A negative result must be combined with ?clinical observations, patient history, and epidemiological ?information. The expected result is Negative. ? ?Fact Sheet for Patients:  ?BloggerCourse.com ? ?Fact Sheet for Healthcare Providers:  ?SeriousBroker.it ? ?This test is no t yet approved or cleared by the [place] FDA and  ?has been authorized for detection and/or diagnosis of SARS-CoV-2 by ?FDA under an Emergency Use Authorization (EUA). This EUA will remain  ?in effect (meaning this test can be used) for the duration of the ?COVID-19 declaration under Section 564(b)(1) of the Act, 21 ?U.S.C.section 360bbb-3(b)(1), unless the authorization is terminated  ?or revoked sooner.  ? ? ?  ? Influenza A by PCR NEGATIVE NEGATIVE  ? Influenza B by PCR NEGATIVE  NEGATIVE  ?  Comment: (NOTE) ?The Xpert Xpress SARS-CoV-2/FLU/RSV plus assay is intended as an aid ?in the diagnosis of influenza from Nasopharyngeal swab specimens and ?should not be used as a sole basis for treatment. Nasal washings and ?aspirates are unacceptable for Xpert Xpress SARS-CoV-2/FLU/RSV ?testing. ? ?Fact Sheet for Patients: ?BloggerCourse.com ? ?Fact Sheet for Healthcare Providers: ?SeriousBroker.it ? ?This test is not yet approved or cleared by the [place] FDA and ?has been authorized for detection and/or diagnosis of SARS-CoV-2 by ?FDA under an Emergency Use Authorization (EUA). This EUA will remain ?in effect (meaning this test can be used) for the duration of the ?COVID-19 declaration under Section 564(b)(1) of the Act, 21 U.S.C. ?section 360bbb-3(b)(1), unless the authorization is terminated or ?revoked. ? ?Performed at [facility], [address], [place], [place] ?[address] ?  ?Glucose, capillary     Status: Abnormal  ? Collection Time: 08/01/21  5:56 AM  ?Result Value Ref Range  ?  Glucose-Capillary 103 (H) 70 - 99 mg/dL  ?  Comment: Glucose reference range applies only to samples taken after fasting for at least 8 hours.  ? ? ?I have reviewed the patient's current medications. ? ?ASSESSMENT: ?Active Problems: ?  Traumatic injury during pregnancy in third trimester ?   Fall ? ?PLAN: ? ?Fall: ?Abruption labs normal ?Fetal ultrasound grossly WNL, awaiting formal read ?No obvious sonographic or clinical signs of abruption. ?Continue observation until late afternoon due to contractions ? ?2. Paresthesia and lower leg weakness ?Pt notes long history of paresthesias since childhood, new onset of left lower leg paresthesia and weakness ?Recommend consult with neurology while patient still in house.  She does not currently have a neurologist. ?Consider PT/OT to make sure pt can walk safely. ? ? ? ?Continue routine antenatal  care. ? ? ?Mariel AloeLawrence Maverick Patman, MD FACOG ?Faculty Attending, [facility] ?08/01/2021 7:06 AM  ?

## 2021-08-02 ENCOUNTER — Ambulatory Visit (INDEPENDENT_AMBULATORY_CARE_PROVIDER_SITE_OTHER): Payer: Medicaid Other | Admitting: Obstetrics and Gynecology

## 2021-08-02 ENCOUNTER — Other Ambulatory Visit: Payer: Self-pay

## 2021-08-02 ENCOUNTER — Encounter: Payer: Self-pay | Admitting: Obstetrics and Gynecology

## 2021-08-02 VITALS — BP 121/79 | HR 89 | Wt 151.5 lb

## 2021-08-02 DIAGNOSIS — O0990 Supervision of high risk pregnancy, unspecified, unspecified trimester: Secondary | ICD-10-CM

## 2021-08-02 DIAGNOSIS — O09299 Supervision of pregnancy with other poor reproductive or obstetric history, unspecified trimester: Secondary | ICD-10-CM

## 2021-08-02 DIAGNOSIS — Z8632 Personal history of gestational diabetes: Secondary | ICD-10-CM

## 2021-08-02 DIAGNOSIS — Z8759 Personal history of other complications of pregnancy, childbirth and the puerperium: Secondary | ICD-10-CM | POA: Diagnosis not present

## 2021-08-02 DIAGNOSIS — O09219 Supervision of pregnancy with history of pre-term labor, unspecified trimester: Secondary | ICD-10-CM

## 2021-08-02 DIAGNOSIS — D563 Thalassemia minor: Secondary | ICD-10-CM

## 2021-08-02 DIAGNOSIS — N390 Urinary tract infection, site not specified: Secondary | ICD-10-CM

## 2021-08-02 DIAGNOSIS — Z3A35 35 weeks gestation of pregnancy: Secondary | ICD-10-CM | POA: Diagnosis not present

## 2021-08-02 DIAGNOSIS — O24415 Gestational diabetes mellitus in pregnancy, controlled by oral hypoglycemic drugs: Secondary | ICD-10-CM

## 2021-08-02 MED ORDER — HYDROXYPROGESTERONE CAPROATE 275 MG/1.1ML ~~LOC~~ SOAJ
275.0000 mg | Freq: Once | SUBCUTANEOUS | Status: AC
Start: 2021-08-02 — End: 2021-08-02
  Administered 2021-08-02: 275 mg via SUBCUTANEOUS

## 2021-08-02 NOTE — Progress Notes (Signed)
Pt presents for ROB and 17P. Pt had a fall and was seen at [facility]. Discharged with no complications. She does not have any concerns today.  ?

## 2021-08-02 NOTE — Progress Notes (Signed)
? ?  PRENATAL VISIT NOTE ? ?Subjective:  ?Andrea [name] is a 31 y.o. [id number] at [redacted]w[redacted]d being seen today for ongoing prenatal care.  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 ? ?Patient doing well with no acute concerns today. She reports no complaints.  Contractions: Not present. Vag. Bleeding: None.  Movement: Present. Denies leaking of fluid.  ? ?Pt just discharged from hospital on 08/01/21 due to a fall.  Diagnosed with LLE weakness, pt had full neuro exam with no findings.  She is doing well today with no issues. ? ?The following portions of the patient's history were reviewed and updated as appropriate: allergies, current medications, past family history, past medical history, past social history, past surgical history and problem list. Problem list updated. ? ?Objective:  ? ?Vitals:  ? 08/02/21 1600  ?BP: 121/79  ?Pulse: 89  ?Weight: 151 lb 8 oz (68.7 kg)  ? ? ?Fetal Status: Fetal Heart Rate (bpm): 146 Fundal Height: 36 cm Movement: Present    ? ?General:  Alert, oriented and cooperative. Patient is in no acute distress.  ?Skin: Skin is warm and dry. No rash noted.   ?Cardiovascular: Normal heart rate noted  ?Respiratory: Normal respiratory effort, no problems with respiration noted  ?Abdomen: Soft,  gravid, appropriate for gestational age.  Pain/Pressure: Present     ?Pelvic: Cervical exam deferred        ?Extremities: Normal range of motion.  Edema: Trace  ?Mental Status:  Normal mood and affect. Normal behavior. Normal judgment and thought content.  ? ?Assessment and Plan:  ?Pregnancy: [id number] at [redacted]w[redacted]d ? ?1. Hx of preeclampsia, prior pregnancy, currently pregnant ?No s/sx of preeclampsia, BP WNL ? ?2. History of gestational diabetes mellitus (GDM) in prior pregnancy, currently pregnant ? ? ?3. Alpha thalassemia silent carrier ? ? ?4. History of placental abruption ?Pt had abruption workup in hospital, normal findings ? ?5. Gestational diabetes mellitus (GDM) in third trimester controlled on oral hypoglycemic drug ?FBS: 108-112 ?PPBS: 92-94 ?Fasting blood sugars still mildly elevated, will monitor ?Anticipate delivery at 38-39 weeks ? ?6. [redacted] weeks gestation of pregnancy ? ? ?7. Urinary tract infection without hematuria, site unspecified ?Pt previously treated ? ?8. Supervision of high risk pregnancy, antepartum ?Continue routine care ? ?9. Previous preterm delivery x 3, antepartum ?Discontinue makena at this time ? ?Preterm labor symptoms and general obstetric precautions including but not limited to vaginal bleeding, contractions, leaking of fluid and fetal movement were reviewed in detail with the patient. ? ?Please refer to After Visit Summary for other counseling recommendations.  ? ?Return in about 1 week (around 08/09/2021) for [facility], in person, 36 weeks swabs. ? ? ?Lynnda Shields, MD ?Faculty Attending ?[facility] [place] ?  ?

## 2021-08-07 ENCOUNTER — Ambulatory Visit: Payer: Medicaid Other | Admitting: *Deleted

## 2021-08-07 ENCOUNTER — Encounter: Payer: Self-pay | Admitting: *Deleted

## 2021-08-07 ENCOUNTER — Other Ambulatory Visit: Payer: Self-pay

## 2021-08-07 ENCOUNTER — Ambulatory Visit: Payer: Medicaid Other | Attending: Obstetrics and Gynecology

## 2021-08-07 VITALS — BP 110/64 | HR 84

## 2021-08-07 DIAGNOSIS — Z3A35 35 weeks gestation of pregnancy: Secondary | ICD-10-CM | POA: Insufficient documentation

## 2021-08-07 DIAGNOSIS — Z8759 Personal history of other complications of pregnancy, childbirth and the puerperium: Secondary | ICD-10-CM | POA: Insufficient documentation

## 2021-08-07 DIAGNOSIS — Z8632 Personal history of gestational diabetes: Secondary | ICD-10-CM | POA: Diagnosis not present

## 2021-08-07 DIAGNOSIS — O09293 Supervision of pregnancy with other poor reproductive or obstetric history, third trimester: Secondary | ICD-10-CM | POA: Diagnosis not present

## 2021-08-07 DIAGNOSIS — O09213 Supervision of pregnancy with history of pre-term labor, third trimester: Secondary | ICD-10-CM

## 2021-08-07 DIAGNOSIS — O24415 Gestational diabetes mellitus in pregnancy, controlled by oral hypoglycemic drugs: Secondary | ICD-10-CM | POA: Insufficient documentation

## 2021-08-07 DIAGNOSIS — O365930 Maternal care for other known or suspected poor fetal growth, third trimester, not applicable or unspecified: Secondary | ICD-10-CM | POA: Insufficient documentation

## 2021-08-07 DIAGNOSIS — D563 Thalassemia minor: Secondary | ICD-10-CM

## 2021-08-07 DIAGNOSIS — O09299 Supervision of pregnancy with other poor reproductive or obstetric history, unspecified trimester: Secondary | ICD-10-CM

## 2021-08-09 ENCOUNTER — Ambulatory Visit (INDEPENDENT_AMBULATORY_CARE_PROVIDER_SITE_OTHER): Payer: Medicaid Other | Admitting: Obstetrics & Gynecology

## 2021-08-09 ENCOUNTER — Other Ambulatory Visit (HOSPITAL_COMMUNITY)
Admission: RE | Admit: 2021-08-09 | Discharge: 2021-08-09 | Disposition: A | Discharge status: Home / Self Care | Payer: Medicaid Other | Source: Ambulatory Visit | Attending: Obstetrics & Gynecology | Admitting: Obstetrics & Gynecology

## 2021-08-09 ENCOUNTER — Other Ambulatory Visit: Payer: Self-pay

## 2021-08-09 ENCOUNTER — Encounter: Payer: Self-pay | Admitting: Obstetrics & Gynecology

## 2021-08-09 ENCOUNTER — Other Ambulatory Visit: Payer: Self-pay | Admitting: Advanced Practice Midwife

## 2021-08-09 VITALS — BP 116/79 | HR 81 | Wt 156.0 lb

## 2021-08-09 DIAGNOSIS — Z3A36 36 weeks gestation of pregnancy: Secondary | ICD-10-CM

## 2021-08-09 DIAGNOSIS — O0990 Supervision of high risk pregnancy, unspecified, unspecified trimester: Secondary | ICD-10-CM

## 2021-08-09 DIAGNOSIS — O365930 Maternal care for other known or suspected poor fetal growth, third trimester, not applicable or unspecified: Secondary | ICD-10-CM

## 2021-08-09 DIAGNOSIS — O09219 Supervision of pregnancy with history of pre-term labor, unspecified trimester: Secondary | ICD-10-CM

## 2021-08-09 DIAGNOSIS — O0993 Supervision of high risk pregnancy, unspecified, third trimester: Secondary | ICD-10-CM | POA: Diagnosis not present

## 2021-08-09 DIAGNOSIS — O24415 Gestational diabetes mellitus in pregnancy, controlled by oral hypoglycemic drugs: Secondary | ICD-10-CM

## 2021-08-09 DIAGNOSIS — O09299 Supervision of pregnancy with other poor reproductive or obstetric history, unspecified trimester: Secondary | ICD-10-CM

## 2021-08-09 LAB — OB RESULTS CONSOLE GC/CHLAMYDIA: Gonorrhea: NEGATIVE

## 2021-08-09 NOTE — Progress Notes (Signed)
? ?PRENATAL VISIT NOTE ? ?Subjective:  ?Andrea [name] is a 31 y.o. [id number] at [redacted]w[redacted]d being seen today for ongoing prenatal care.  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 ? ?Patient reports  possible leaking of fluid .  Contractions: Irregular. Vag. Bleeding: None.  Movement: Present. Denies leaking of fluid.  ? ?The following portions of the patient's history were reviewed and updated as appropriate: allergies, current medications, past family history, past medical history, past social history, past surgical history and problem list.  ? ?Objective:  ? ?Vitals:  ? 08/09/21 1538  ?BP: 116/79  ?Pulse: 81  ?Weight: 156 lb (70.8 kg)  ? ? ?Fetal Status: Fetal Heart Rate (bpm): 138   Movement: Present  Presentation: Vertex ? ?General:  Alert, oriented and cooperative. Patient is in no acute distress.  ?Skin: Skin is warm and dry. No rash noted.   ?Cardiovascular: Normal heart rate noted  ?Respiratory: Normal respiratory effort, no problems with respiration noted  ?Abdomen: Soft, gravid, appropriate for gestational age.  Pain/Pressure: Present     ?Pelvic: Cervical exam performed in the presence of a chaperone Dilation: 4 Effacement (%): 50 Station: -2 Amniotest negative for SROM, no pooling. BBOW  felt on exam. Cultures obtained.  ?Extremities: Normal range of motion.  Edema: Mild pitting, slight indentation  ?Mental Status: Normal mood and affect. Normal behavior. Normal judgment and thought content.  ? ?Recent Imaging ?MR BRAIN WO CONTRAST ? ?Result Date: 08/01/2021 ?CLINICAL DATA:  Neuro deficit, acute, stroke suspected. Possible ACA stroke. Tingling and decreased sensation in the left lower extremity. Pregnant patient. EXAM: MRI HEAD WITHOUT CONTRAST TECHNIQUE: Multiplanar, multiecho pulse sequences of the brain and surrounding structures were obtained without intravenous contrast. COMPARISON:  None. FINDINGS: Brain: Diffusion imaging does not show any acute or subacute infarction or other cause of restricted diffusion. Cerebral hemispheres are normal without evidence of old small or large vessel insult. No mass lesion, hemorrhage, hydrocephalus or extra-axial collection. Vascular: No abnormal vascular finding. Skull and upper cervical spine: Negative Sinuses/Orbits: Chronic mucoperiosteal thickening of the sphenoid sinus. Other sinuses are clear. Tiny amount of mastoid fluid. Other: None IMPRESSION: Normal appearance of the brain itself. No evidence of stroke or other intracranial pathology. Chronic mucoperiosteal thickening of the sphenoid sinuses. Electronically Signed   By: Nelson Chimes M.D.   On: 08/01/2021 15:15  ? ?MR LUMBAR SPINE WO CONTRAST ? ?Result Date: 08/01/2021 ?CLINICAL DATA:  Acute left lower extremity weakness, pregnant EXAM: MRI LUMBAR SPINE WITHOUT CONTRAST TECHNIQUE: Multiplanar, multisequence MR imaging of the lumbar spine was performed. No intravenous contrast was administered. COMPARISON:  None. FINDINGS: Segmentation:  Standard. Alignment:  Preserved. Vertebrae: Vertebral body heights are maintained. There is no marrow edema. No suspicious osseous lesion. Conus medullaris and cauda equina: Conus extends to the L1-L2 level. Conus and cauda equina appear normal. Paraspinal and other  soft tissues: Mild hydronephrosis, right greater than left. Disc levels: Intervertebral disc heights and signal are  maintained. No disc herniation or canal or foraminal stenosis at any level. IMPRESSION: No disc herniation or evidence of nerve root compression. Mild, right greater than left hydronephrosis. Electronically Signed   By: Macy Mis M.D.   On: 08/01/2021 15:19  ? ?[place] MFM FETAL BPP WO NON STRESS ? ?Result Date: 08/07/2021 ?----------------------------------------------------------------------  OBSTETRICS REPORT                       (Signed Final 08/07/2021 04:39 pm) ---------------------------------------------------------------------- Patient Info  ID #:       [id number]                          D.O.B.:  01-08-1991 (30 yrs)  Name:       Andrea [name]                    Visit Date: 08/07/2021 03:44 pm ---------------------------------------------------------------------- Performed By  Attending:        Johnell Comings MD         Ref. Address:     [place], [place]  Performed By:     Georgie Chard        [facility]                                                             [place] [facility]                                                             [facility]  Referred By:      Sallyanne Havers  Harolyn Rutherford MD ---------------------------------------------------------------------- Orders  #  Description                           Code        Ordered By  1  [place] MFM FETAL BPP WO NON               76819.01    YU FANG     STRESS  2  [place] MFM UA CORD DOPPLER                76820.02    YU FANG ----------------------------------------------------------------------  #  Order #                     Accession #                 Episode #  1  [id number]                   [id number]                 [id number]  2  [id number]                   [id number]                 [id number] ---------------------------------------------------------------------- Indications  Maternal care for known or suspected poor      O36.5930  fetal growth, third trimester, not applicable or  unspecified IUGR  [redacted] weeks gestation of pregnancy                Z3A.35  Traumatic injury during pregnancy (fell)       O9A.219 T14.90  Gestational diabetes in pregnancy,             O24.415  controlled by oral hypoglycemic drugs 4  Prior poor obstetrical history antepartum,     O09.293  third trimester  Poor obstetric history: Previous preterm       O09.219  delivery, antepartum (x3) (17p)  Previous pregnancy with congenital anomaly     O35.2XX0  (gastroschisis) ---------------------------------------------------------------------- Fetal Evaluation  Num Of Fetuses:         1  Fetal Heart Rate(bpm):  147  Cardiac Activity:       Observed  Presentation:           Cephalic  Placenta:               Anterior  P. Cord Insertion:      Previously Visualized  Amniotic Fluid  AFI FV:      Within normal limits  AFI Sum(cm)     %Tile       Largest Pocket(cm)  13.38           46          4.21  RUQ(cm)       RLQ(cm)       LUQ(cm)        LLQ(cm)  4.16          2.62          4.21           2.39 ---------------------------------------------------------------------- Biophysical Evaluation  Amniotic F.V:   Within normal limits       F. Tone:        Observed  F. Movement:    Observed  Score:          8/8  F. Breathing:   Observed ---------------------------------------------------------------------- OB History  Gravidity:    6         Term:   0        Prem:   3        SAB:   0  TOP:          2       Ectopic:  0        Living: 3 ---------------------------------------------------------------------- Gestational Age  LMP:           35w 5d        Date:  11/30/20                 EDD:    09/06/21  Best:          35w 5d     Det. By:  LMP  (11/30/20)          EDD:   09/06/21 ---------------------------------------------------------------------- Doppler - Fetal Vessels  Umbilical Artery   S/D

## 2021-08-09 NOTE — Progress Notes (Signed)
ROB [redacted] wks GA ?GBS, GC/CC today ?Last Makena 08/02/21: last dose ? ?Recent fall on abdomen, [facility]U visit, cleared ?Reports "peeing on myself since last night", had episode of UCs prior to that, panties are wet even after peeing and has to wear a pad. ?Repeat US with MFM Monday, IUGR, tentative plan for delivery at 37 wks. Patient would like to know dates to make childcare arrangements ?

## 2021-08-10 ENCOUNTER — Telehealth (HOSPITAL_COMMUNITY): Payer: Self-pay | Admitting: *Deleted

## 2021-08-10 ENCOUNTER — Encounter (HOSPITAL_COMMUNITY): Payer: Self-pay | Admitting: *Deleted

## 2021-08-10 LAB — CERVICOVAGINAL ANCILLARY ONLY
Chlamydia: NEGATIVE
Comment: NEGATIVE
Comment: NORMAL
Neisseria Gonorrhea: NEGATIVE

## 2021-08-10 NOTE — Telephone Encounter (Signed)
Preadmission screen  

## 2021-08-11 ENCOUNTER — Inpatient Hospital Stay (HOSPITAL_COMMUNITY)
Admission: AD | Admit: 2021-08-11 | Discharge: 2021-08-11 | Disposition: A | Discharge status: Home / Self Care | Payer: Medicaid Other | Attending: Obstetrics & Gynecology | Admitting: Obstetrics & Gynecology

## 2021-08-11 ENCOUNTER — Other Ambulatory Visit: Payer: Self-pay | Admitting: Advanced Practice Midwife

## 2021-08-11 ENCOUNTER — Encounter (HOSPITAL_COMMUNITY): Payer: Self-pay | Admitting: Obstetrics & Gynecology

## 2021-08-11 ENCOUNTER — Other Ambulatory Visit: Payer: Self-pay

## 2021-08-11 DIAGNOSIS — O4703 False labor before 37 completed weeks of gestation, third trimester: Secondary | ICD-10-CM | POA: Insufficient documentation

## 2021-08-11 DIAGNOSIS — Z8759 Personal history of other complications of pregnancy, childbirth and the puerperium: Secondary | ICD-10-CM

## 2021-08-11 DIAGNOSIS — O479 False labor, unspecified: Secondary | ICD-10-CM | POA: Diagnosis not present

## 2021-08-11 DIAGNOSIS — D563 Thalassemia minor: Secondary | ICD-10-CM

## 2021-08-11 DIAGNOSIS — R519 Headache, unspecified: Secondary | ICD-10-CM | POA: Insufficient documentation

## 2021-08-11 DIAGNOSIS — R109 Unspecified abdominal pain: Secondary | ICD-10-CM

## 2021-08-11 DIAGNOSIS — O24415 Gestational diabetes mellitus in pregnancy, controlled by oral hypoglycemic drugs: Secondary | ICD-10-CM

## 2021-08-11 DIAGNOSIS — O09299 Supervision of pregnancy with other poor reproductive or obstetric history, unspecified trimester: Secondary | ICD-10-CM

## 2021-08-11 DIAGNOSIS — Z3A36 36 weeks gestation of pregnancy: Secondary | ICD-10-CM | POA: Insufficient documentation

## 2021-08-11 LAB — URINALYSIS, ROUTINE W REFLEX MICROSCOPIC
Bilirubin Urine: NEGATIVE
Glucose, UA: NEGATIVE mg/dL
Ketones, ur: NEGATIVE mg/dL
Nitrite: NEGATIVE
Protein, ur: NEGATIVE mg/dL
Specific Gravity, Urine: 1.002 — ABNORMAL LOW (ref 1.005–1.030)
pH: 6 (ref 5.0–8.0)

## 2021-08-11 MED ORDER — CYCLOBENZAPRINE HCL 5 MG PO TABS
10.0000 mg | ORAL_TABLET | Freq: Once | ORAL | Status: DC
Start: 2021-08-11 — End: 2021-08-11

## 2021-08-11 MED ORDER — ACETAMINOPHEN 500 MG PO TABS
1000.0000 mg | ORAL_TABLET | Freq: Once | ORAL | Status: AC
  Administered 2021-08-11: 1000 mg via ORAL
  Filled 2021-08-11: qty 2

## 2021-08-11 NOTE — MAU Provider Note (Addendum)
?History  ?  ? ?CSN: [id number] ? ?Arrival date and time: 08/11/21 1649 ? ? Event Date/Time  ? First Provider Initiated Contact with Patient 08/11/21 1842   ?  ? ?Chief Complaint  ?Patient presents with  ? Contractions  ? ?HPI ?31 yo [id number] at [redacted]w[redacted]d who presents with lower abdominal pain and vaginal pressure. No LOF. No vaginal bleeding. Reports has been feeling increased pressure since 11AM. She feels pressure in constant. Can see stomach tightening but doesn't feel pain with it ?Cervix was 3.5 cm in office two days ago ? ?Also reports DFM today. Has not eaten or drank much today. ? ?Also reports HA. Hasn't tried anything for HA. ? ?OB History   ? ? Gravida  ?6  ? Para  ?3  ? Term  ?   ? Preterm  ?3  ? AB  ?2  ? Living  ?3  ?  ? ? SAB  ?   ? IAB  ?2  ? Ectopic  ?   ? Multiple  ?   ? Live Births  ?3  ?   ?  ?  ? ? ?Past Medical History:  ?Diagnosis Date  ? Bipolar depression ([place])   ? Gestational diabetes   ? Glaucoma   ? 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 RND (reflex neurovascular dystrophy)   ? TMJ (dislocation of temporomandibular joint)   ? ? ?Past Surgical History:  ?Procedure Laterality Date  ? TONSILLECTOMY AND ADENOIDECTOMY  2003  ? ? ?Family History  ?Problem Relation Age of Onset  ? Breast cancer Mother 48  ? Hyperlipidemia Father   ? Alcoholism Father   ? Diabetes Sister   ? Post-traumatic stress disorder Sister   ? Colon cancer Maternal Grandmother   ? Diabetes Mellitus II Paternal Grandmother   ? ? ?Social History  ? ?Tobacco Use  ? Smoking status: Never  ? Smokeless tobacco: Never  ?Vaping Use  ? Vaping Use: Never used  ?Substance Use Topics  ? Alcohol use: Not Currently  ? Drug use: No  ? ? ?Allergies:  ?Allergies  ?Allergen Reactions  ? Cymbalta [Duloxetine Hcl] Other (See Comments)  ?  BLACK OUT with first dose - "causes me to feel more suicidal"  ? Ultrasound Gel Hives and Rash  ?  Denies airway involvement  ? ? ?Medications Prior to Admission  ?Medication Sig Dispense Refill Last  Dose  ? Accu-Chek Softclix Lancets lancets 1 each by Other route 4 (four) times daily. 100 each 12   ? acetaminophen (TYLENOL) 325 MG tablet Take 2 tablets (650 mg total) by mouth every 4 (four) hours as needed (for pain scale < 4  OR  temperature  >/=  100.5 F).     ? aspirin 81 MG chewable tablet Chew 1 tablet (81 mg total) by mouth daily. 30 tablet 6   ? Blood Glucose Monitoring Suppl (ACCU-CHEK GUIDE) w/Device KIT 1 Device by Does not apply route 4 (four) times daily. 1 kit 0   ? Blood Pressure Monitoring (BLOOD PRESSURE KIT) DEVI Monitor blood pressure readings at home regularly 1 each 0   ? glucose blood (ACCU-CHEK GUIDE) test strip Use to check blood sugars four times a day was instructed 50 each 12   ? metFORMIN (GLUCOPHAGE) 500 MG tablet Take 1 tablet (500 mg total) by mouth 2 (two) times daily with a meal. 60 tablet 2   ? Misc. Devices (GOJJI WEIGHT SCALE) MISC 1 Device by Does not apply route as needed.  1 each 0   ? pantoprazole (PROTONIX) 40 MG tablet Take 1 tablet (40 mg total) by mouth daily. 30 tablet 11   ? Prenat-FeCbn-FeAsp-Meth-FA-DHA (PRENATE MINI) 18-0.6-0.4-350 MG CAPS Take 1 capsule by mouth daily. 30 capsule 11   ? ? ?Review of Systems  ?Constitutional:  Negative for fever.  ?Respiratory:  Negative for chest tightness and shortness of breath.   ?Cardiovascular:  Negative for chest pain.  ?Gastrointestinal:  Positive for abdominal pain. Negative for nausea and vomiting.  ?Endocrine: Negative for polyuria.  ?Genitourinary:  Negative for dysuria.  ?Musculoskeletal:  Negative for back pain.  ?All other systems reviewed and are negative. ?Physical Exam  ? ?Blood pressure 112/74, pulse 93, temperature 98.5 ?F (36.9 ?C), temperature source Oral, resp. rate 16, last menstrual period 11/30/2020, SpO2 100 %, currently breastfeeding. ? ?Physical Exam ?Vitals and nursing note reviewed. Exam conducted with a chaperone present.  ?Constitutional:   ?   Appearance: Normal appearance.  ?HENT:  ?   Head:  Normocephalic.  ?Eyes:  ?   Extraocular Movements: Extraocular movements intact.  ?   Pupils: Pupils are equal, round, and reactive to light.  ?Cardiovascular:  ?   Rate and Rhythm: Normal rate.  ?   Pulses: Normal pulses.  ?Pulmonary:  ?   Effort: Pulmonary effort is normal.  ?Abdominal:  ?   Comments: Gravid, no TTP  ?Musculoskeletal:     ?   General: Normal range of motion.  ?   Cervical back: Normal range of motion.  ?Skin: ?   General: Skin is warm.  ?   Capillary Refill: Capillary refill takes less than 2 seconds.  ?Neurological:  ?   General: No focal deficit present.  ?   Mental Status: She is alert and oriented to person, place, and time.  ?Psychiatric:     ?   Mood and Affect: Mood normal.  ? ? ?Cervical exam: 3.5 cm without change between checks ?MAU Course  ?Procedures ?NST reactive: ?FHR 130s-140s, >3 15x15 accels present, no decels ?Toco: uterine irritability. Occassional contractions 7-15 mins ? ?MDM ?Minimal ?31 yo [id number] at [redacted]w[redacted]d who presents with lower abdominal pain and vaginal pressure. Not in labor as cervix not changing and not many contractions. Reactive NST. HA likely MSK related and patient opted to take Flexeril at home ? ?Assessment and Plan  ?False labor/vaginal pressure ?Cervix unchanged ?Irregular contractions ?Return precautions provided ?Vaginal pressure likely related to descent into pelvis of fetus but no signs of imminent delivery. ? ?2. HA ?Patient reports headache that was not helped with Tylenol. BP normotensive 100s-110s/ 50s-80s. Given tylenol but headache present. Offered Flexeril but since labor ruled out plan to take flexeril that she has at home. Return precautions provided in detail. ?Renard Matter, MD, MPH ?OB Fellow, Faculty Practice ? ?

## 2021-08-11 NOTE — MAU Note (Addendum)
..  Andrea [name] is a 31 y.o. at [redacted]w[redacted]d here in MAU reporting: Constant lower back pain that began around 1100 this morning as well as constant lower vaginal pressure since 1400 today. She states she is unsure if she is having contractions because she has never been able to feel them. She states she can visually see her stomach tightening but states it is not painful. DFM. She states since 0930 this morning the baby has continually started moving less. Endorses a HA that began around 1400 today while driving her children to [place]. Denies VB or LOF.  ? ?Fetal movement clicker given. ? ?Pain score:  ?8/10 lower back ?10/10 vagina ?6/10 HA ? ?Lab orders placed from triage: UA ? ?

## 2021-08-11 NOTE — Discharge Instructions (Signed)
You came to MAU because you had contractions and did not feel your baby move as much. We gave you some juice and put your baby on the monitor and your baby look good. We checked your cervix and it did not change between your two checks and was likely the same as your check in the office a few days ago.  ? ?You did have a headache but your blood pressures were normal. You also had back pain and vaginal pressure. We think this is related to your pregnancy in advanced stages. ? ?Please seek medical care if you think your water is broken, you have heavy vaginal bleeding, or you have painful contractions less than 5 minutes apart. ? ? ?

## 2021-08-13 ENCOUNTER — Other Ambulatory Visit: Payer: Self-pay | Admitting: Advanced Practice Midwife

## 2021-08-13 DIAGNOSIS — O365990 Maternal care for other known or suspected poor fetal growth, unspecified trimester, not applicable or unspecified: Secondary | ICD-10-CM | POA: Insufficient documentation

## 2021-08-13 LAB — CULTURE, BETA STREP (GROUP B ONLY): Strep Gp B Culture: NEGATIVE

## 2021-08-14 ENCOUNTER — Ambulatory Visit: Payer: Medicaid Other | Attending: Obstetrics and Gynecology

## 2021-08-14 ENCOUNTER — Other Ambulatory Visit: Payer: Self-pay

## 2021-08-14 ENCOUNTER — Ambulatory Visit: Payer: Medicaid Other | Admitting: *Deleted

## 2021-08-14 ENCOUNTER — Encounter: Payer: Self-pay | Admitting: *Deleted

## 2021-08-14 VITALS — BP 124/69 | HR 83

## 2021-08-14 DIAGNOSIS — Z3A36 36 weeks gestation of pregnancy: Secondary | ICD-10-CM | POA: Diagnosis not present

## 2021-08-14 DIAGNOSIS — O365990 Maternal care for other known or suspected poor fetal growth, unspecified trimester, not applicable or unspecified: Secondary | ICD-10-CM | POA: Diagnosis not present

## 2021-08-14 DIAGNOSIS — O09293 Supervision of pregnancy with other poor reproductive or obstetric history, third trimester: Secondary | ICD-10-CM

## 2021-08-14 DIAGNOSIS — O09299 Supervision of pregnancy with other poor reproductive or obstetric history, unspecified trimester: Secondary | ICD-10-CM | POA: Insufficient documentation

## 2021-08-14 DIAGNOSIS — O09213 Supervision of pregnancy with history of pre-term labor, third trimester: Secondary | ICD-10-CM | POA: Diagnosis not present

## 2021-08-14 DIAGNOSIS — Z8759 Personal history of other complications of pregnancy, childbirth and the puerperium: Secondary | ICD-10-CM | POA: Diagnosis not present

## 2021-08-14 DIAGNOSIS — D563 Thalassemia minor: Secondary | ICD-10-CM | POA: Diagnosis not present

## 2021-08-14 DIAGNOSIS — O24415 Gestational diabetes mellitus in pregnancy, controlled by oral hypoglycemic drugs: Secondary | ICD-10-CM

## 2021-08-14 DIAGNOSIS — O365930 Maternal care for other known or suspected poor fetal growth, third trimester, not applicable or unspecified: Secondary | ICD-10-CM | POA: Diagnosis not present

## 2021-08-16 ENCOUNTER — Inpatient Hospital Stay (HOSPITAL_COMMUNITY): Payer: Medicaid Other

## 2021-08-16 ENCOUNTER — Encounter: Payer: Medicaid Other | Admitting: Obstetrics and Gynecology

## 2021-08-16 ENCOUNTER — Other Ambulatory Visit: Payer: Self-pay

## 2021-08-16 ENCOUNTER — Inpatient Hospital Stay (HOSPITAL_COMMUNITY): Payer: Medicaid Other | Admitting: Anesthesiology

## 2021-08-16 ENCOUNTER — Encounter (HOSPITAL_COMMUNITY): Payer: Self-pay | Admitting: Family Medicine

## 2021-08-16 ENCOUNTER — Inpatient Hospital Stay (HOSPITAL_COMMUNITY)
Admission: AD | Admit: 2021-08-16 | Discharge: 2021-08-18 | DRG: 807 | Disposition: A | Discharge status: Home / Self Care | Payer: Medicaid Other | Attending: Obstetrics & Gynecology | Admitting: Obstetrics & Gynecology

## 2021-08-16 DIAGNOSIS — O24419 Gestational diabetes mellitus in pregnancy, unspecified control: Secondary | ICD-10-CM

## 2021-08-16 DIAGNOSIS — D563 Thalassemia minor: Secondary | ICD-10-CM | POA: Diagnosis not present

## 2021-08-16 DIAGNOSIS — O09299 Supervision of pregnancy with other poor reproductive or obstetric history, unspecified trimester: Secondary | ICD-10-CM

## 2021-08-16 DIAGNOSIS — Z3A37 37 weeks gestation of pregnancy: Secondary | ICD-10-CM

## 2021-08-16 DIAGNOSIS — O24415 Gestational diabetes mellitus in pregnancy, controlled by oral hypoglycemic drugs: Principal | ICD-10-CM

## 2021-08-16 DIAGNOSIS — O24425 Gestational diabetes mellitus in childbirth, controlled by oral hypoglycemic drugs: Secondary | ICD-10-CM | POA: Diagnosis not present

## 2021-08-16 DIAGNOSIS — O9902 Anemia complicating childbirth: Secondary | ICD-10-CM | POA: Diagnosis not present

## 2021-08-16 DIAGNOSIS — O43893 Other placental disorders, third trimester: Secondary | ICD-10-CM | POA: Diagnosis not present

## 2021-08-16 DIAGNOSIS — O365990 Maternal care for other known or suspected poor fetal growth, unspecified trimester, not applicable or unspecified: Secondary | ICD-10-CM

## 2021-08-16 DIAGNOSIS — D649 Anemia, unspecified: Secondary | ICD-10-CM | POA: Diagnosis not present

## 2021-08-16 DIAGNOSIS — O365930 Maternal care for other known or suspected poor fetal growth, third trimester, not applicable or unspecified: Secondary | ICD-10-CM | POA: Diagnosis present

## 2021-08-16 DIAGNOSIS — Z3A00 Weeks of gestation of pregnancy not specified: Secondary | ICD-10-CM | POA: Diagnosis not present

## 2021-08-16 DIAGNOSIS — O24429 Gestational diabetes mellitus in childbirth, unspecified control: Secondary | ICD-10-CM | POA: Diagnosis not present

## 2021-08-16 DIAGNOSIS — Z8759 Personal history of other complications of pregnancy, childbirth and the puerperium: Secondary | ICD-10-CM

## 2021-08-16 LAB — CBC
HCT: 30.9 % — ABNORMAL LOW (ref 36.0–46.0)
Hemoglobin: 9.7 g/dL — ABNORMAL LOW (ref 12.0–15.0)
MCH: 23.2 pg — ABNORMAL LOW (ref 26.0–34.0)
MCHC: 31.4 g/dL (ref 30.0–36.0)
MCV: 73.7 fL — ABNORMAL LOW (ref 80.0–100.0)
Platelets: 291 10*3/uL (ref 150–400)
RBC: 4.19 MIL/uL (ref 3.87–5.11)
RDW: 15.8 % — ABNORMAL HIGH (ref 11.5–15.5)
WBC: 9.1 10*3/uL (ref 4.0–10.5)
nRBC: 0 % (ref 0.0–0.2)

## 2021-08-16 LAB — GLUCOSE, CAPILLARY
Glucose-Capillary: 88 mg/dL (ref 70–99)
Glucose-Capillary: 94 mg/dL (ref 70–99)
Glucose-Capillary: 94 mg/dL (ref 70–99)

## 2021-08-16 LAB — TYPE AND SCREEN
ABO/RH(D): A POS
Antibody Screen: NEGATIVE

## 2021-08-16 LAB — COMPREHENSIVE METABOLIC PANEL
ALT: 10 U/L (ref 0–44)
AST: 15 U/L (ref 15–41)
Albumin: 2.1 g/dL — ABNORMAL LOW (ref 3.5–5.0)
Alkaline Phosphatase: 79 U/L (ref 38–126)
Anion gap: 8 (ref 5–15)
BUN: 6 mg/dL (ref 6–20)
CO2: 23 mmol/L (ref 22–32)
Calcium: 8.6 mg/dL — ABNORMAL LOW (ref 8.9–10.3)
Chloride: 105 mmol/L (ref 98–111)
Creatinine, Ser: 0.54 mg/dL (ref 0.44–1.00)
GFR, Estimated: >60 mL/min (ref >60)
Glucose, Bld: 121 mg/dL — ABNORMAL HIGH (ref 70–99)
Potassium: 3.8 mmol/L (ref 3.5–5.1)
Sodium: 136 mmol/L (ref 135–145)
Total Bilirubin: 0.5 mg/dL (ref 0.3–1.2)
Total Protein: 5.6 g/dL — ABNORMAL LOW (ref 6.5–8.1)

## 2021-08-16 LAB — RPR: RPR Ser Ql: NONREACTIVE

## 2021-08-16 MED ORDER — FENTANYL CITRATE (PF) 100 MCG/2ML IJ SOLN
100.0000 ug | INTRAMUSCULAR | Status: DC | PRN
  Administered 2021-08-16: 100 ug via INTRAVENOUS
  Filled 2021-08-16: qty 2

## 2021-08-16 MED ORDER — LIDOCAINE-EPINEPHRINE (PF) 2 %-1:200000 IJ SOLN
INTRAMUSCULAR | Status: DC | PRN
  Administered 2021-08-16: 3 mL via EPIDURAL

## 2021-08-16 MED ORDER — OXYTOCIN-SODIUM CHLORIDE 30-0.9 UT/500ML-% IV SOLN
2.5000 [IU]/h | INTRAVENOUS | Status: DC
  Filled 2021-08-16: qty 500

## 2021-08-16 MED ORDER — ONDANSETRON HCL 4 MG/2ML IJ SOLN
4.0000 mg | Freq: Four times a day (QID) | INTRAMUSCULAR | Status: DC | PRN
  Administered 2021-08-16: 4 mg via INTRAVENOUS
  Filled 2021-08-16: qty 2

## 2021-08-16 MED ORDER — PHENYLEPHRINE 40 MCG/ML (10ML) SYRINGE FOR IV PUSH (FOR BLOOD PRESSURE SUPPORT): 80.0000 ug | PREFILLED_SYRINGE | INTRAVENOUS | Status: DC | PRN

## 2021-08-16 MED ORDER — LIDOCAINE HCL (PF) 1 % IJ SOLN: 30.0000 mL | INTRAMUSCULAR | Status: DC | PRN

## 2021-08-16 MED ORDER — SOD CITRATE-CITRIC ACID 500-334 MG/5ML PO SOLN
30.0000 mL | ORAL | Status: DC | PRN
Start: 2021-08-16 — End: 2021-08-17

## 2021-08-16 MED ORDER — OXYTOCIN-SODIUM CHLORIDE 30-0.9 UT/500ML-% IV SOLN: 1.0000 m[IU]/min | INTRAVENOUS | Status: DC

## 2021-08-16 MED ORDER — OXYTOCIN BOLUS FROM INFUSION
333.0000 mL | Freq: Once | INTRAVENOUS | Status: AC
Start: 2021-08-16 — End: 2021-08-16
  Administered 2021-08-16: 333 mL via INTRAVENOUS

## 2021-08-16 MED ORDER — FENTANYL CITRATE (PF) 100 MCG/2ML IJ SOLN
INTRAMUSCULAR | Status: DC | PRN
  Administered 2021-08-16: 100 ug via EPIDURAL

## 2021-08-16 MED ORDER — OXYCODONE-ACETAMINOPHEN 5-325 MG PO TABS: 2.0000 | ORAL_TABLET | ORAL | Status: DC | PRN

## 2021-08-16 MED ORDER — PHENYLEPHRINE 40 MCG/ML (10ML) SYRINGE FOR IV PUSH (FOR BLOOD PRESSURE SUPPORT)
80.0000 ug | PREFILLED_SYRINGE | INTRAVENOUS | Status: DC | PRN
  Filled 2021-08-16: qty 10

## 2021-08-16 MED ORDER — EPHEDRINE 5 MG/ML INJ: 10.0000 mg | INTRAVENOUS | Status: DC | PRN

## 2021-08-16 MED ORDER — BUPIVACAINE HCL (PF) 0.25 % IJ SOLN
INTRAMUSCULAR | Status: DC | PRN
  Administered 2021-08-16 (×2): 3 mL via EPIDURAL

## 2021-08-16 MED ORDER — DIPHENHYDRAMINE HCL 50 MG/ML IJ SOLN
12.5000 mg | INTRAMUSCULAR | Status: DC | PRN
  Administered 2021-08-16 (×2): 12.5 mg via INTRAVENOUS
  Filled 2021-08-16: qty 1

## 2021-08-16 MED ORDER — TERBUTALINE SULFATE 1 MG/ML IJ SOLN: 0.2500 mg | Freq: Once | INTRAMUSCULAR | Status: DC | PRN

## 2021-08-16 MED ORDER — FENTANYL-BUPIVACAINE-NACL 0.5-0.125-0.9 MG/250ML-% EP SOLN
12.0000 mL/h | EPIDURAL | Status: DC | PRN
  Administered 2021-08-16: 12 mL/h via EPIDURAL
  Filled 2021-08-16: qty 250

## 2021-08-16 MED ORDER — OXYTOCIN-SODIUM CHLORIDE 30-0.9 UT/500ML-% IV SOLN
1.0000 m[IU]/min | INTRAVENOUS | Status: DC
  Administered 2021-08-16: 2 m[IU]/min via INTRAVENOUS

## 2021-08-16 MED ORDER — LACTATED RINGERS IV SOLN: INTRAVENOUS | Status: DC

## 2021-08-16 MED ORDER — LACTATED RINGERS IV SOLN: 500.0000 mL | INTRAVENOUS | Status: DC | PRN

## 2021-08-16 MED ORDER — ACETAMINOPHEN 325 MG PO TABS: 650.0000 mg | ORAL_TABLET | ORAL | Status: DC | PRN

## 2021-08-16 MED ORDER — OXYCODONE-ACETAMINOPHEN 5-325 MG PO TABS: 1.0000 | ORAL_TABLET | ORAL | Status: DC | PRN

## 2021-08-16 MED ORDER — EPHEDRINE 5 MG/ML INJ
10.0000 mg | INTRAVENOUS | Status: DC | PRN
Start: 2021-08-16 — End: 2021-08-17

## 2021-08-16 MED ORDER — LACTATED RINGERS IV SOLN
500.0000 mL | Freq: Once | INTRAVENOUS | Status: AC
  Administered 2021-08-16: 500 mL via INTRAVENOUS

## 2021-08-16 MED ORDER — SODIUM BICARBONATE 8.4 % IV SOLN
INTRAVENOUS | Status: DC | PRN
  Administered 2021-08-16: 5 mL via EPIDURAL

## 2021-08-16 NOTE — H&P (Signed)
OBSTETRIC ADMISSION HISTORY AND PHYSICAL ? ?Jenene Kauffmann is a 31 y.o. female [phone] with IUP at 19w0dby LMP presenting for IOL due to [place] She reports +FMs, no LOF, no VB, no blurry vision, headaches, peripheral edema, or RUQ pain.  She plans on breast feeding. She is planning for BTL for birth control postpartum. ? ?She received her prenatal care at [facility]. ? ?Dating: By LMP --->  Estimated Date of Delivery: 09/06/21 ? ?Sono:   ?_0 , normal anatomy, cephalic presentation, anterior placental lie, 2552 g, 14% EFW ? 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 ?Past Medical History: ?Past Medical History:  ?Diagnosis Date  ? [place] depression ([place]   ? Gestational diabetes   ? Glaucoma   ? 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 RND (reflex neurovascular dystrophy)   ? TMJ (dislocation of temporomandibular joint)   ? ? ?Past Surgical History: ?Past Surgical History:  ?Procedure Laterality Date  ? TONSILLECTOMY AND ADENOIDECTOMY  2003  ? ? ?Obstetrical History: ?OB History   ? ? Gravida  ?6  ? Para  ?3  ? Term  ?   ? Preterm  ?3  ? AB  ?2  ? Living  ?3  ?  ? ? SAB  ?   ? IAB  ?2  ? Ectopic  ?   ? Multiple  ?   ? Live Births  ?3  ?   ?  ?  ? ? ?Social History ?Social History  ? ?Socioeconomic History  ? Marital status: Significant Other  ?  Spouse name: Not on file  ? Number of children: Not on file  ? Years of education: Not on file  ? Highest education level: Not on file  ?Occupational History  ? Occupation: [employer]   ?Tobacco Use  ? Smoking status: Never  ? Smokeless tobacco: Never  ?Vaping Use  ? Vaping Use: Never used  ?Substance and Sexual Activity  ? Alcohol use: Not Currently  ? Drug use: No  ? Sexual activity: Yes  ?  Birth control/protection: None  ?  Comment: nexplanon  ?Other Topics  Concern  ? Not on file  ?Social History Narrative  ? Lives with kids and JJeneen Rinks Has been with JJeneen Rinksfor 15 years, since 6th grade.   ? ?Social Determinants of Health  ? ?Financial Resource Strain: Not on file  ?Food Insecurity: No Food Insecurity  ? Worried About RCharity fundraiserin the Last Year: Never true  ? Ran Out of Food in the Last Year: Never true  ?Transportation Needs: Not on file  ?Physical Activity: Not on file  ?Stress: Not on file  ?Social Connections: Not on file  ? ? ?Family History: ?Family History  ?Problem Relation Age of Onset  ? Breast cancer Mother 35 ? Hyperlipidemia Father   ? Alcoholism Father   ? Diabetes Sister   ? Post-traumatic stress disorder Sister   ? Colon cancer Maternal Grandmother   ? Diabetes Mellitus II Paternal Grandmother   ? ? ?Allergies: ?Allergies  ?Allergen Reactions  ? Cymbalta [Duloxetine Hcl] Other (See Comments)  ?  BLACK OUT with first dose - "causes me to feel more suicidal"  ? Ultrasound Gel Hives and Rash  ?  Denies airway involvement  ? ? ?Medications Prior to Admission  ?  Medication Sig Dispense Refill Last Dose  ? Accu-Chek Softclix Lancets lancets 1 each by Other route 4 (four) times daily. 100 each 12 08/15/2021  ? acetaminophen (TYLENOL) 325 MG tablet Take 2 tablets (650 mg total) by mouth every 4 (four) hours as needed (for pain scale < 4  OR  temperature  >/=  100.5 F).   Past Month  ? Blood Glucose Monitoring Suppl (ACCU-CHEK GUIDE) w/Device KIT 1 Device by Does not apply route 4 (four) times daily. 1 kit 0 08/15/2021  ? Blood Pressure Monitoring (BLOOD PRESSURE KIT) DEVI Monitor blood pressure readings at home regularly 1 each 0 08/15/2021  ? glucose blood (ACCU-CHEK GUIDE) test strip Use to check blood sugars four times a day was instructed 50 each 12 08/15/2021  ? metFORMIN (GLUCOPHAGE) 500 MG tablet Take 1 tablet (500 mg total) by mouth 2 (two) times daily with a meal. 60 tablet 2 08/15/2021  ? Misc. Devices (GOJJI WEIGHT SCALE) MISC 1 Device by Does not  apply route as needed. 1 each 0 Past Week  ? pantoprazole (PROTONIX) 40 MG tablet Take 1 tablet (40 mg total) by mouth daily. 30 tablet 11 08/15/2021  ? Prenat-FeCbn-FeAsp-Meth-FA-DHA (PRENATE MINI) 18-0.6-0.4-350 MG CAPS Take 1 capsule by mouth daily. 30 capsule 11 08/15/2021  ? aspirin 81 MG chewable tablet Chew 1 tablet (81 mg total) by mouth daily. 30 tablet 6   ? ? ? ?Review of Systems  ?All systems reviewed and negative except as stated in HPI ? ?Blood pressure 112/72, pulse 79, temperature 98.1 ?F (36.7 ?C), temperature source Oral, resp. rate 16, height _0  (1.549 m), weight 71.1 kg, last menstrual period 11/30/2020, currently breastfeeding. ? ?General appearance: alert, cooperative, and no distress ?Lungs: normal work of breathing on room air  ?Heart: normal rate, warm and well perfused  ?Abdomen: soft, non-tender, gravid  ?Extremities: no LE edema  ? ?Presentation:  Cephalic per RN ? ?Fetal monitoring: Baseline 135 bpm, moderate variability, + accels, no decels  ?Uterine activity: Every 1-3 minutes  ?Dilation: 4 ?Effacement (%): 30 ?Station: -3 ?Exam by:: Ginger Carne, RN ? ? ?Prenatal labs: ?ABO, Rh: --/--/A POS (03/29 0805) ?Antibody: NEG (03/29 0805) ?Rubella: 2.67 (10/05 1132) ?RPR: Non Reactive (01/23 1049)  ?HBsAg: Negative (10/05 1132)  ?HIV: Non Reactive (01/23 1049)  ?GBS: Negative/-- (03/22 1622)  ?2 hr Glucola - Abnormal  ?Genetic screening - LR NIPS, alpha thalassemia carrier, AFP negative  ?Anatomy US normal  ? ?Prenatal Transfer Tool  ?Maternal Diabetes: Yes:  Diabetes Type:  Insulin/Medication controlled ?Genetic Screening: LR NIPS, alpha thalassemia carrier, AFP negative  ?Maternal Ultrasounds/Referrals: SGA fetus, otherwise normal  ?Fetal Ultrasounds or other Referrals:  Referred to [facility]  ?Maternal Substance Abuse:  Hx of opioid use; UDS negative 06/2021 ?Significant Maternal Medications:  Metformin, taking 500 mg nightly  ?Significant Maternal Lab Results: Group B Strep  negative ? ?Results for orders placed or performed during the hospital encounter of 08/16/21 (from the past 24 hour(s))  ?CBC  ? Collection Time: 08/16/21  8:05 AM  ?Result Value Ref Range  ? WBC 9.1 4.0 - 10.5 K/uL  ? RBC 4.19 3.87 - 5.11 MIL/uL  ? Hemoglobin 9.7 (L) 12.0 - 15.0 g/dL  ? HCT 30.9 (L) 36.0 - 46.0 %  ? MCV 73.7 (L) 80.0 - 100.0 fL  ? MCH 23.2 (L) 26.0 - 34.0 pg  ? MCHC 31.4 30.0 - 36.0 g/dL  ? RDW 15.8 (H) 11.5 - 15.5 %  ? Platelets 291  150 - 400 K/uL  ? nRBC 0.0 0.0 - 0.2 %  ?Type and screen  ? Collection Time: 08/16/21  8:05 AM  ?Result Value Ref Range  ? ABO/RH(D) A POS   ? Antibody Screen NEG   ? Sample Expiration    ?  08/19/2021,2359 ?Performed at [facility], [place] [address], [place], [place] [address] ?  ? ? ?Patient Active Problem List  ? Diagnosis Date Noted  ? GDM, class A2 08/16/2021  ? Fetal growth restriction antepartum 08/13/2021  ? Traumatic injury during pregnancy in third trimester 08/01/2021  ? beta lactamase resistant e coli 07/27/2021  ? Gestational diabetes 06/16/2021  ? History of preeclampsia, prior pregnancy, currently pregnant 03/22/2021  ? Alpha thalassemia silent carrier 03/22/2021  ? Previous preterm delivery x 3, antepartum 03/22/2021  ? History of placental abruption 03/22/2021  ? 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 Supervision of high risk pregnancy, antepartum 02/13/2021  ? Glaucoma 02/13/2021  ? History of opioid abuse ([place]) 01/09/2019  ? Alcohol abuse 01/09/2019  ? Bipolar 1 disorder ([place]) 03/07/2017  ? Migraine with aura and without status migrainosus, not intractable 07/23/2014  ? Recurrent depression ([place]) 04/14/2012  ? Asthma, intermittent 08/23/2009  ? ? ?Assessment/Plan:  ?Renlee Floor is a 31 y.o. [phone] at 18w0dhere for IOL due to poorly controlled A2GDM.  ? ?#Labor: Favorable SVE. Will start Pitocin 2x2 and reassess in 3-4 hours. Plan for AROM on next check.  ?#Pain: PRN; would like to avoid epidural if able  ?#FWB: Cat 1 ?#ID:  GBS  negative  ?#MOF: Breast  ?#MOC: BTL (consent 06/12/21) ? ?#A2GDM: Poorly controlled. Will check CBGs every 4 hours in latent labor. Will add SSI as needed. EFW 14%.  ? ?CGenia Del MD  ?08/16/2021, 9:55

## 2021-08-16 NOTE — Anesthesia Preprocedure Evaluation (Signed)
Anesthesia Evaluation  ?Patient identified by MRN, date of birth, ID band ?Patient awake ? ? ? ?Reviewed: ?Allergy & Precautions, Patient's Chart, lab work & pertinent test results ? ?History of Anesthesia Complications ?Negative for: history of anesthetic complications ? ?Airway ?Mallampati: II ? ?TM Distance: >3 FB ?Neck ROM: Full ? ? ? Dental ?no notable dental hx. ?(+) Dental Advisory Given, Teeth Intact ?  ?Pulmonary ?asthma ,  ?  ?breath sounds clear to auscultation ? ? ? ? ? ? Cardiovascular ?negative cardio ROS ? ? ?Rhythm:Regular  ? ?  ?Neuro/Psych ?negative neurological ROS ? negative psych ROS  ? GI/Hepatic ?negative GI ROS, Neg liver ROS,   ?Endo/Other  ?diabetes, Gestational ? Renal/GU ?negative Renal ROS  ? ?  ?Musculoskeletal ? ? Abdominal ?  ?Peds ? Hematology ? ?(+) Blood dyscrasia, anemia , Lab Results ?     Component                Value               Date                 ?     WBC                      9.1                 08/16/2021           ?     HGB                      9.7 (L)             08/16/2021           ?     HCT                      30.9 (L)            08/16/2021           ?     MCV                      73.7 (L)            08/16/2021           ?     PLT                      291                 08/16/2021           ?   ?Anesthesia Other Findings ? ? Reproductive/Obstetrics ?(+) Pregnancy ? ?  ? ? ? ? ? ? ? ? ? ? ? ? ? ?  ?  ? ? ? ? ? ? ? ? ?Anesthesia Physical ?Anesthesia Plan ? ?ASA: 2 ? ?Anesthesia Plan: Epidural  ? ?Post-op Pain Management: Minimal or no pain anticipated  ? ?Induction:  ? ?PONV Risk Score and Plan: 2 and Treatment may vary due to age or medical condition ? ?Airway Management Planned: Natural Airway ? ?Additional Equipment: None ? ?Intra-op Plan:  ? ?Post-operative Plan:  ? ?Informed Consent: I have reviewed the patients History and Physical, chart, labs and discussed the procedure including the risks, benefits and alternatives for the  proposed anesthesia with the patient or authorized representative who has indicated his/her understanding  and acceptance.  ? ? ? ? ? ?Plan Discussed with: Anesthesiologist ? ?Anesthesia Plan Comments:   ? ? ? ? ? ? ?Anesthesia Quick Evaluation ? ?

## 2021-08-16 NOTE — Progress Notes (Signed)
Labor Progress Note ?Andrea [name] is a 31 y.o. [id number] at [redacted]w[redacted]d presented for IOL due to poorly controlled A2GDM.  ? ?S: Doing well. Comfortable with epidural. No concerns at this time.  ? ?O:  ?BP 108/66   Pulse 74   Temp 98.1 ?F (36.7 ?C) (Oral)   Resp 16   Ht 5\' 1"  (1.549 m)   Wt 71.1 kg   LMP 11/30/2020   SpO2 100%   BMI 29.61 kg/m?  ? ?EFM: Baseline 135 bpm, moderate variability, + accels, no decels  ?Toco: Every 1-3 minutes  ? ?CVE: Dilation: 4.5 ?Effacement (%): 50 ?Cervical Position: Posterior ?Station: -3 ?Presentation: Vertex ?Exam by:: Dr. [email or web address] ? ?A&P: 31 y.o. 37 [redacted]w[redacted]d  ? ?#Labor: Progressing well. AROM discussed and patient verbally consented. AROM performed with large amount of clear fluid. Mom and baby tolerated this well. Will continue Pitocin and reassess in 4 hours, sooner as needed.  ?#Pain: Epidural  ?#FWB: Cat 1  ?#GBS negative ? ?#A2GDM: CBGs at goal. Will continue to monitor every 4 hours in latent phase.  ? ?[redacted]w[redacted]d, MD ?4:48 PM ? ?

## 2021-08-16 NOTE — Anesthesia Procedure Notes (Signed)
Epidural ?Patient location during procedure: OB ?Start time: 08/16/2021 2:21 PM ?End time: 08/16/2021 2:44 PM ? ?Staffing ?Anesthesiologist: Val Eagle, MD ?Performed: anesthesiologist  ? ?Preanesthetic Checklist ?Completed: patient identified, IV checked, risks and benefits discussed, monitors and equipment checked, pre-op evaluation and timeout performed ? ?Epidural ?Patient position: sitting ?Prep: DuraPrep ?Patient monitoring: heart rate, continuous pulse ox and blood pressure ?Approach: midline ?Location: L3-L4 ?Injection technique: LOR saline ? ?Needle:  ?Needle type: Tuohy  ?Needle gauge: 17 G ?Needle length: 9 cm ?Needle insertion depth: 6 cm ?Catheter type: closed end flexible ?Catheter size: 19 Gauge ?Catheter at skin depth: 12 cm ?Test dose: negative and 2% lidocaine with Epi 1:200 K ? ?Assessment ?Events: blood not aspirated, injection not painful, no injection resistance, no paresthesia and negative IV test ? ? ? ?

## 2021-08-16 NOTE — Progress Notes (Addendum)
Andrea [name] is a 31 y.o. [id number] at [redacted]w[redacted]d by LMP admitted for induction of labor due to Gestational diabetesA2. ? ?Subjective: ?Pt reports fatigue and itching at bs. Denies discomfort and pressure with CTXs.  ? ?Objective: ?BP 118/75   Pulse 94   Temp 98.1 ?F (36.7 ?C) (Oral)   Resp 16   Ht 5\' 1"  (1.549 m)   Wt 71.1 kg   LMP 11/30/2020   SpO2 100%   BMI 29.61 kg/m?  ?No intake/output data recorded. ?No intake/output data recorded. ? ?FHT:  FHR: 145 bpm, variability: moderate,  accelerations:  Present,  decelerations:  Absent ?UC:   regular, every 1.5-2.5 minutes ?SVE:   Dilation: 5.5 ?Effacement (%): 80 ?Station: -1 ?Exam by:: Onome A CNM-S ? ?Labs: ?Lab Results  ?Component Value Date  ? WBC 9.1 08/16/2021  ? HGB 9.7 (L) 08/16/2021  ? HCT 30.9 (L) 08/16/2021  ? MCV 73.7 (L) 08/16/2021  ? PLT 291 08/16/2021  ? ? ?Assessment / Plan: ?mIOL for A2GDM in protracted latent labor now progressing on pitocin.   ?Continue current management  ? ?Labor:  progressing on pitocin ?Preeclampsia:   n/a ?Fetal Wellbeing:  Category I ?Pain Control:  Epidural ?I/D:  n/a ?Anticipated MOD:  NSVD ? ? ?08/18/2021, SNM ?08/16/2021, 9:02 PM ? ?Attestation: ? ?I confirm that I have verified the information documented in the  SNM ?s note and that I have also personally reperformed the physical exam and all medical decision making activities.  ? ?08/18/2021, CNM ? ? ? ?

## 2021-08-17 ENCOUNTER — Inpatient Hospital Stay (HOSPITAL_COMMUNITY): Payer: Medicaid Other | Admitting: Anesthesiology

## 2021-08-17 ENCOUNTER — Encounter (HOSPITAL_COMMUNITY)
Admission: AD | Disposition: A | Discharge status: Home / Self Care | Payer: Self-pay | Source: Home / Self Care | Attending: Obstetrics & Gynecology

## 2021-08-17 ENCOUNTER — Encounter (HOSPITAL_COMMUNITY): Payer: Self-pay | Admitting: Family Medicine

## 2021-08-17 SURGERY — LIGATION, FALLOPIAN TUBE, POSTPARTUM
Anesthesia: Choice | Laterality: Bilateral

## 2021-08-17 MED ORDER — MEDROXYPROGESTERONE ACETATE 150 MG/ML IM SUSP
150.0000 mg | INTRAMUSCULAR | Status: DC | PRN
Start: 2021-08-17 — End: 2021-08-18

## 2021-08-17 MED ORDER — METHYLERGONOVINE MALEATE 0.2 MG PO TABS: 0.2000 mg | ORAL_TABLET | ORAL | Status: DC | PRN

## 2021-08-17 MED ORDER — LACTATED RINGERS IV SOLN: INTRAVENOUS | Status: DC

## 2021-08-17 MED ORDER — PRENATAL MULTIVITAMIN CH
1.0000 | ORAL_TABLET | Freq: Every day | ORAL | Status: DC
  Administered 2021-08-17 – 2021-08-18 (×2): 1 via ORAL
  Filled 2021-08-17 (×2): qty 1

## 2021-08-17 MED ORDER — TETANUS-DIPHTH-ACELL PERTUSSIS 5-2.5-18.5 LF-MCG/0.5 IM SUSY: 0.5000 mL | PREFILLED_SYRINGE | Freq: Once | INTRAMUSCULAR | Status: DC

## 2021-08-17 MED ORDER — DOCUSATE SODIUM 100 MG PO CAPS
100.0000 mg | ORAL_CAPSULE | Freq: Two times a day (BID) | ORAL | Status: DC
  Administered 2021-08-17 – 2021-08-18 (×3): 100 mg via ORAL
  Filled 2021-08-17 (×3): qty 1

## 2021-08-17 MED ORDER — DIBUCAINE (PERIANAL) 1 % EX OINT: 1.0000 "application " | TOPICAL_OINTMENT | CUTANEOUS | Status: DC | PRN

## 2021-08-17 MED ORDER — DIPHENHYDRAMINE HCL 25 MG PO CAPS: 25.0000 mg | ORAL_CAPSULE | Freq: Four times a day (QID) | ORAL | Status: DC | PRN

## 2021-08-17 MED ORDER — SENNOSIDES-DOCUSATE SODIUM 8.6-50 MG PO TABS
2.0000 | ORAL_TABLET | Freq: Every day | ORAL | Status: DC
  Administered 2021-08-17 – 2021-08-18 (×2): 2 via ORAL
  Filled 2021-08-17 (×2): qty 2

## 2021-08-17 MED ORDER — BENZOCAINE-MENTHOL 20-0.5 % EX AERO: 1.0000 "application " | INHALATION_SPRAY | CUTANEOUS | Status: DC | PRN

## 2021-08-17 MED ORDER — WITCH HAZEL-GLYCERIN EX PADS: 1.0000 "application " | MEDICATED_PAD | CUTANEOUS | Status: DC | PRN

## 2021-08-17 MED ORDER — ONDANSETRON HCL 4 MG PO TABS: 4.0000 mg | ORAL_TABLET | ORAL | Status: DC | PRN

## 2021-08-17 MED ORDER — METHYLERGONOVINE MALEATE 0.2 MG/ML IJ SOLN: 0.2000 mg | INTRAMUSCULAR | Status: DC | PRN

## 2021-08-17 MED ORDER — METOCLOPRAMIDE HCL 10 MG PO TABS
10.0000 mg | ORAL_TABLET | Freq: Once | ORAL | Status: AC
  Administered 2021-08-17: 10 mg via ORAL
  Filled 2021-08-17: qty 1

## 2021-08-17 MED ORDER — ACETAMINOPHEN 325 MG PO TABS: 650.0000 mg | ORAL_TABLET | ORAL | Status: DC | PRN

## 2021-08-17 MED ORDER — ONDANSETRON HCL 4 MG/2ML IJ SOLN: 4.0000 mg | INTRAMUSCULAR | Status: DC | PRN

## 2021-08-17 MED ORDER — MEASLES, MUMPS & RUBELLA VAC IJ SOLR: 0.5000 mL | Freq: Once | INTRAMUSCULAR | Status: DC

## 2021-08-17 MED ORDER — FERROUS SULFATE 325 (65 FE) MG PO TABS
325.0000 mg | ORAL_TABLET | ORAL | Status: DC
  Administered 2021-08-17: 325 mg via ORAL
  Filled 2021-08-17: qty 1

## 2021-08-17 MED ORDER — SIMETHICONE 80 MG PO CHEW: 80.0000 mg | CHEWABLE_TABLET | ORAL | Status: DC | PRN

## 2021-08-17 MED ORDER — ONDANSETRON HCL 4 MG/2ML IJ SOLN
INTRAMUSCULAR | Status: AC
  Filled 2021-08-17: qty 2

## 2021-08-17 MED ORDER — ACETAMINOPHEN 10 MG/ML IV SOLN
INTRAVENOUS | Status: AC
  Filled 2021-08-17: qty 100

## 2021-08-17 MED ORDER — IBUPROFEN 600 MG PO TABS
600.0000 mg | ORAL_TABLET | Freq: Four times a day (QID) | ORAL | Status: DC
  Administered 2021-08-17 – 2021-08-18 (×3): 600 mg via ORAL
  Filled 2021-08-17 (×5): qty 1

## 2021-08-17 MED ORDER — FAMOTIDINE 20 MG PO TABS
40.0000 mg | ORAL_TABLET | Freq: Once | ORAL | Status: AC
  Administered 2021-08-17: 40 mg via ORAL
  Filled 2021-08-17: qty 2

## 2021-08-17 MED ORDER — COCONUT OIL OIL: 1.0000 "application " | TOPICAL_OIL | Status: DC | PRN

## 2021-08-17 NOTE — Progress Notes (Signed)
Post Partum Day 1 ?Subjective: ?no complaints, up ad lib, voiding, and tolerating PO ? ?Objective: ?Blood pressure 121/68, pulse 70, temperature 98.2 ?F (36.8 ?C), resp. rate 16, height 5\' 1"  (1.549 m), weight 71.1 kg, last menstrual period 11/30/2020, SpO2 100 %, unknown if currently breastfeeding. ? ?Physical Exam:  ?General: alert, cooperative, and no distress ?Lochia: appropriate ?Uterine Fundus: firm ?Incision: n/a ?DVT Evaluation: No evidence of DVT seen on physical exam. ? ?Recent Labs  ?  08/16/21 ?0805  ?HGB 9.7*  ?HCT 30.9*  ? ? ?Assessment/Plan: ?Plan for discharge tomorrow, Breastfeeding, and Contraception BTL today ? ? LOS: 1 day  ? ?Andrea [name] ?08/17/2021, 6:49 AM  ? ? ?

## 2021-08-17 NOTE — Progress Notes (Signed)
Postpartum tubal consent/Reproductive life planning: ? ?31 y.o. [id number]  with undesired fertility,status post vaginal delivery, expressed desire for permanent sterilization.  I reviewed with the patient her expressed plan for tubal sterilization.  The patient stated "is it too late to not do this" and I confirmed that it was not too late and I was here to make sure she wanted  her tubal and to talk about options.  ? ?We dicussed other reversible forms of contraception including LARC options with similar effectiveness of BTS.  ? ?We discussed risks of bilateral tubal sterilization with patient including but not limited to: risk of regret, permanence of method, bleeding, infection, injury to surrounding organs and need for additional procedures.  Failure risk of 0.5-1% with increased risk of ectopic gestation if pregnancy occurs was also discussed with patient.   ? ?The patient expressed clearly she did not want her tubal today and desires placement of an copper IUD at her postpartum visit in 6 weeks. We discussed she can have the IUD and if she decides she does want a tubal, this can be done between pregnancies. Partner Casimiro Needle was also in the room and heard the discussion about reproductive life planning and options for contraception.  ? ?Federico Flake, MD, MPH, ABFM, IBCLC ?Attending Physician ?[facility] ? ?

## 2021-08-17 NOTE — Lactation Note (Signed)
This note was copied from a baby's chart. ?Lactation Consultation Note ?Declines the need for Lactation. ? ?Patient Name: Andrea [name] ?Today's Date: 08/17/2021 ?  ?Age:31 hours ? ?Maternal Data ?  ? ?Feeding ?  ? ?LATCH Score ?  ? ?  ? ?  ? ?  ? ?  ? ?  ? ? ?Lactation Tools Discussed/Used ?  ? ?Interventions ?  ? ?Discharge ?  ? ?Consult Status ?Consult Status: Complete ? ? ? ?Charyl Dancer ?08/17/2021, 12:14 AM ? ? ? ?

## 2021-08-17 NOTE — Lactation Note (Signed)
This note was copied from a baby's chart. ?Lactation Consultation Note ? ?Patient Name: Andrea [name] ?Today's Date: 08/17/2021 ?Reason for consult: Follow-up assessment;Early term 37-38.6wks ?Age:30 hours ? ?LC student in to see P4 mom of early term female infant. Mom is currently pumping, supplementing with MBM and formula. Reviewed supplementation guidelines and volume expectations. Mom is pumping 15-3mls each time She desires to pump and formula feed. Mom understands to pump 8-12x per 24 hours. Discussed possible engorgement over the next 24-48 hours given she has produced milk before.  ? ?Plan: ?1) Pump 8-12x per 24 hours for 15-20 minutes. ?2) Feed baby expressed breast milk before supplementing with formula. ?3) Prioritize skin to skin, rest, hydration, and nutrition.  ? ?Maternal Data ?Has patient been taught Hand Expression?: Yes ?Does the patient have breastfeeding experience prior to this delivery?: Yes ? ?Feeding ?Mother's Current Feeding Choice: Breast Milk and Formula ?Nipple Type: Slow - flow ? ?Lactation Tools Discussed/Used ?Breast pump type: Double-Electric Breast Pump ?Pump Education: Setup, frequency, and cleaning;Milk Storage ?Reason for Pumping: Stimulation and supplementation ? ?Interventions ?Interventions: Skin to skin;Breast massage;Breast feeding basics reviewed;Hand express;DEBP ? ?Discharge ?Discharge Education: Engorgement and breast care ?Pump: DEBP ?WIC Program: Yes ? ?Consult Status ?Consult Status: Follow-up ?Date: 08/18/21 ?Follow-up type: In-patient ? ? ?Antionette Char ?08/17/2021, 10:39 PM ? ? ? ?

## 2021-08-17 NOTE — Anesthesia Postprocedure Evaluation (Signed)
Anesthesia Post Note ? ?Patient: Andrea [name] ? ?Procedure(s) Performed: AN AD HOC LABOR EPIDURAL ? ?  ? ?Patient location during evaluation: Mother Baby ?Anesthesia Type: Epidural ?Level of consciousness: awake and alert and oriented ?Pain management: satisfactory to patient ?Vital Signs Assessment: post-procedure vital signs reviewed and stable ?Respiratory status: respiratory function stable ?Cardiovascular status: stable ?Postop Assessment: no headache, no backache, epidural receding, patient able to bend at knees, no signs of nausea or vomiting, adequate PO intake and able to ambulate ?Anesthetic complications: no ? ? ?No notable events documented. ? ?Last Vitals:  ?Vitals:  ? 08/17/21 0530 08/17/21 0816  ?BP: 121/68 101/66  ?Pulse: 70 71  ?Resp:  18  ?Temp:  36.8 ?C  ?SpO2:  99%  ?  ?Last Pain:  ?Vitals:  ? 08/17/21 0816  ?TempSrc: Oral  ?PainSc:   ? ?Pain Goal:   ? ?  ?  ?  ?  ?  ?  ?  ? ?Zi Newbury ? ? ? ? ?

## 2021-08-17 NOTE — Lactation Note (Signed)
This note was copied from a baby's chart. ?Lactation Consultation Note ?Mom declined LC earlier but decided to see [facility]. ?Mom wanted a DEBP set up d/t baby wasn't interested in BF. ?Mom latched baby herself before [place] went into rm. Mom states she just very sleepy mom wants to give the baby colostrum for supplement. ?Mom shown how to use DEBP & how to disassemble, clean, & reassemble parts. Mom knows to pump q3h for 15-20 min.  ?Mom pumped 8 ml colostrum. Mom encouraged to feed baby 8-12 times/24 hours and with feeding cues.   ?Baby sleeping now. Mom will give colostrum after next feeding or if baby not interested in BF mom will see if she will take the colostrum since mom is GDM.  ?Encouraged mom to call for assistance as needed. ?Newborn feeding habits, STS, I&O, supply and demand discussed. ? ?Patient Name: Andrea [name] ?Today's Date: 08/17/2021 ?Reason for consult: Initial assessment;Early term 37-38.6wks ?Age:58 hours ? ?Maternal Data ?  ? ?Feeding ?  ? ?LATCH Score ?  ? ?  ? ?Type of Nipple: Everted at rest and after stimulation ? ?Comfort (Breast/Nipple): Filling, red/small blisters or bruises, mild/mod discomfort (mom states her breast are filling) ? ?  ? ?  ? ? ?Lactation Tools Discussed/Used ?Tools: Pump ?Breast pump type: Double-Electric Breast Pump ?Pump Education: Setup, frequency, and cleaning;Milk Storage ?Reason for Pumping: mom requested ?Pumping frequency: q3h ?Pumped volume: 8 mL ? ?Interventions ?Interventions: Breast feeding basics reviewed;DEBP;LC Services brochure ? ?Discharge ?  ? ?Consult Status ?Consult Status: Follow-up ?Date: 08/17/21 ?Follow-up type: In-patient ? ? ? ?Theodoro Kalata ?08/17/2021, 4:12 AM ? ? ? ?

## 2021-08-17 NOTE — Anesthesia Preprocedure Evaluation (Deleted)
Anesthesia Evaluation  ? ? ?Reviewed: ?Allergy & Precautions, Patient's Chart, lab work & pertinent test results ? ?Airway ? ? ? ? ? ? ? Dental ?  ?Pulmonary ?asthma ,  ?  ? ? ? ? ? ? ? Cardiovascular ?negative cardio ROS ? ? ? ? ?  ?Neuro/Psych ? Headaches, PSYCHIATRIC DISORDERS Depression Bipolar Disorder   ? GI/Hepatic ?Neg liver ROS, GERD  Medicated and Controlled,  ?Endo/Other  ?diabetes, Gestational, Oral Hypoglycemic Agents ? Renal/GU ?negative Renal ROS  ?negative genitourinary ?  ?Musculoskeletal ?negative musculoskeletal ROS ?(+)  ? Abdominal ?  ?Peds ? Hematology ? ?(+) Blood dyscrasia, anemia , Hb 9.7, plt 291   ?Anesthesia Other Findings ? ? Reproductive/Obstetrics ?Delivered <12h ago w/ epidural  ?Desires sterility ? ?  ? ? ? ? ? ? ? ? ? ? ? ? ? ?  ?  ? ? ? ? ? ? ? ?Anesthesia Physical ?Anesthesia Plan ? ?ASA: 3 ? ?Anesthesia Plan: Epidural  ? ?Post-op Pain Management: Ofirmev IV (intra-op)* and Toradol IV (intra-op)*  ? ?Induction:  ? ?PONV Risk Score and Plan: 2 ? ?Airway Management Planned: Natural Airway ? ?Additional Equipment: None ? ?Intra-op Plan:  ? ?Post-operative Plan:  ? ?Informed Consent:  ? ?Plan Discussed with:  ? ?Anesthesia Plan Comments:   ? ? ? ? ? ? ?Anesthesia Quick Evaluation ? ?

## 2021-08-18 ENCOUNTER — Other Ambulatory Visit (HOSPITAL_COMMUNITY): Payer: Self-pay

## 2021-08-18 LAB — SURGICAL PATHOLOGY

## 2021-08-18 MED ORDER — FERROUS SULFATE 325 (65 FE) MG PO TABS
325.0000 mg | ORAL_TABLET | ORAL | 3 refills | Status: DC
  Filled 2021-08-18 – 2021-11-09 (×2): qty 15, 30d supply, fill #0
  Filled 2021-12-14: qty 15, 30d supply, fill #1

## 2021-08-18 MED ORDER — IBUPROFEN 600 MG PO TABS
600.0000 mg | ORAL_TABLET | Freq: Four times a day (QID) | ORAL | 0 refills | Status: DC | PRN
  Filled 2021-08-18: qty 40, 10d supply, fill #0

## 2021-08-18 MED ORDER — ACETAMINOPHEN 500 MG PO TABS: 1000.0000 mg | ORAL_TABLET | Freq: Three times a day (TID) | ORAL | 0 refills | Status: DC | PRN

## 2021-08-18 NOTE — Clinical Social Work Maternal (Signed)
?CLINICAL SOCIAL WORK MATERNAL/CHILD NOTE ? ?Patient Details  ?Name: Andrea [name] ?MRN: [id number] ?Date of Birth: 25-Apr-1991 ? ?Date:  08/18/2021 ? ?Clinical Social Worker Initiating Note:  Kathrin Greathouse LCSW Date/Time: Initiated:  08/18/21/1109    ? ?Child's Name:  Sedalia Muta  ? ?Biological Parents:  Mother, Father Alyssha Housh [birth year] 1991  and Rayne Du 01-06-1983)  ? ?Need for Interpreter:  None  ? ?Reason for Referral:  Behavioral Health Concerns  ? ?Address:  [place] [address]  ?  ?Phone number:  [phone] (home)    ? ?Additional phone number:  ? ?Household Members/Support Persons (HM/SP):   Household Member/Support Person 1, Household Member/Support Person 2, Household Member/Support Person 3, Household Member/Support Person 5, Household Member/Support Person 6, Household Member/Support Person 4 ? ? ?HM/SP Name Relationship DOB or Age  ?HM/SP -1 Nonah Mattes Significant Other 01-06-1983  ?HM/SP -2 Aleene Swanner Son 12-25-2009  ?HM/SP -3 Jackquline Bosch Son 01-21-2014  ?HM/SP -4 [facility] Daughter 08-05-2016  ?HM/SP -5 Mauri Reading Daughter 01-10-2010  ?HM/SP -6 Gwynne Edinger Daughger 05-23-2019  ?HM/SP -7     ?HM/SP -8        ? ? ?Natural Supports (not living in the home):     ? ?Professional Supports: None  ? ?Employment: Unemployed  ? ?Type of Work:    ? ?Education:  Some College  ? ?Homebound arranged:   ? ?Financial Resources:  Medicaid  ? ?Other Resources:  Physicist, medical  , [place]  ? ?Cultural/Religious Considerations Which May Impact Care:   ? ?Strengths:  Ability to meet basic needs  , Home prepared for child  , Pediatrician chosen ([facility])  ? ?Psychotropic Medications:        ? ?Pediatrician:    [place] area ? ?Pediatrician List:  ? ?[facility]  ?[place]    ?[facility]    ?[facility]    ?[facility]    ?[facility]    ? ? ?Pediatrician Fax Number:   ? ?Risk Factors/Current Problems:  Mental Health  Concerns    ? ?Cognitive State:  Able to Concentrate  , Insightful  , Linear Thinking    ? ?Mood/Affect:  Comfortable  , Calm  , Bright  , Happy    ? ?CSW Assessment: CSW received consult for hx of Anxiety and Depression.  CSW met with MOB to offer support and complete assessment.   ? ?CSW met with MOB at bedside and introduced CSW role. CSW observed MOB holding the infant in bed and FOB present at bedside. CSW offered MOB privacy. MOB stated, "He can stay during the assessment." CSW proceeded with the assessment. MOB presented calm and receptive to the visit. MOB confirmed that the demographic information on hospital file is correct. MOB reported she lives with FOB and her children (see list above). MOB identified FOB as her support. MOB reported that she is unemployed, and FOB works for [employer]. MOB reported she receives WIC/FS benefits and will call to update the agencies about the birth.   ? ?MOB expressed feeling good since giving birth. CSW inquired about MOB mental health history. MOB acknowledged that she has a hx of Bipolar depression and diagnosed in 2009. MOB reported the last time she an episode was in 2018. MOB reported no concerns during the pregnancy. MOB reported she does not take medication and is not interested. MOB reported no therapy and reported she is not interested. (Per notes, MOB saw [place] counselor during the  pregnancy). CSW inquired about MOB coping skills. MOB reported that she "drinks alcohol." CSW assessed further. MOB explained she has not been drinking during the pregnancy and she does not plan to drink anytime soon since she will be breastfeeding the infant. MOB reported that drinking is not an issue and declined the need for treatment. CSW led MOB in exploring healthier coping strategies. MOB reported she likes to exercise and take walks. MOB reported she and her significant other have "pillow talk." CSW acknowledged MOB efforts and encouraged her to continue using them. CSW offered to  provide a resources with coping skills so that MOB can implement them in her day to day. MOB was receptive. CSW discussed PPD. MOB reported she has not experienced PPD with any of her children. CSW provided education regarding the baby blues period vs. perinatal mood disorders, discussed treatment and gave resources for mental health follow up if concerns arise.  CSW recommended MOB complete self-evaluation during the postpartum time period using the New Mom Checklist from Postpartum Progress and encouraged MOB to contact a medical professional if symptoms are noted at any time. CSW assessed MOB for safety. MOB denied thoughts of harm to self and others.  ? ?CSW provided review of Sudden Infant Death Syndrome (SIDS) precautions. MOB reported understanding. MOB reported she has essential items for the infant including a bassinet and crib where the infant will sleep. MOB has chosen [facility] for the infants follow up care. CSW assessed MOB for additional need. MOB reported no further need.  ? ?CSW identifies no further need for intervention and no barriers to discharge at this time.  ? ?CSW Plan/Description:  Sudden Infant Death Syndrome (SIDS) Education, Perinatal Mood and Anxiety Disorder (PMADs) Education, No Further Intervention Required/No Barriers to Discharge  ? ? ?Lia Hopping, LCSW ?08/18/2021, 11:41 AM ? ?

## 2021-08-18 NOTE — Discharge Summary (Signed)
? ?  Postpartum Discharge Summary ? ?   ?Patient Name: Andrea [name] ?DOB: 09/15/1990 ?MRN: [id number] ? ?Date of admission: 08/16/2021 ?Delivery date:08/16/2021  ?Delivering provider: AGBAZA, ONOME S  ?Date of discharge: 08/18/2021 ? ?Admitting diagnosis: GDM, class A2 [O24.419] ?Intrauterine pregnancy: [redacted]w[redacted]d     ?Secondary diagnosis:  Principal Problem: ?  GDM, class A2 ? ?Additional problems: None    ?Discharge diagnosis: Term Pregnancy Delivered                                              ?Post partum procedures: None ?Augmentation: AROM and Pitocin ?Complications: None ? ?Hospital course: Induction of Labor With Vaginal Delivery   ?31 y.o. yo [id number] at [redacted]w[redacted]d was admitted to the hospital 08/16/2021 for induction of labor.  Indication for induction: A2 DM.  Patient had an uncomplicated labor course as follows: ?Membrane Rupture Time/Date: 4:33 PM ,08/16/2021   ?Delivery Method:Vaginal, Spontaneous  ?Episiotomy: None  ?Lacerations:  None  ?Details of delivery can be found in separate delivery note.  Patient had a routine postpartum course. Patient is discharged home 08/18/21. ? ?Newborn Data: ?Birth date:08/16/2021  ?Birth time:11:22 PM  ?Gender:Female  ?Living status:Living  ?Apgars:9 ,9  ?Weight:2870 g  ? ?Magnesium Sulfate received: No ?BMZ received: No ?Rhophylac:N/A ?MMR:N/A ?T-DaP:Given prenatally ?Flu: N/A ?Transfusion:No ? ?Physical exam  ?Vitals:  ? 08/17/21 0816 08/17/21 1230 08/17/21 2138 08/18/21 0640  ?BP: 101/66 113/68 105/60 (!) 93/58  ?Pulse: 71 69 63 77  ?Resp: $Remov'18 16 16 16  'ZsfRKO$ ?Temp: 98.2 ?F (36.8 ?C) 98.3 ?F (36.8 ?C) 98.3 ?F (36.8 ?C) 98.1 ?F (36.7 ?C)  ?TempSrc: Oral Oral    ?SpO2: 99%  100% 97%  ?Weight:      ?Height:      ? ?General: alert, cooperative, and no distress ?Lochia: appropriate ?Uterine Fundus: firm ?Incision: N/A ?DVT Evaluation: No cords or calf tenderness. ?No significant calf/ankle edema. ?Labs: ?Lab Results  ?Component Value Date  ? WBC 9.1 08/16/2021  ? HGB 9.7 (L) 08/16/2021  ? HCT  30.9 (L) 08/16/2021  ? MCV 73.7 (L) 08/16/2021  ? PLT 291 08/16/2021  ? ? ?  Latest Ref Rng & Units 08/16/2021  ?  8:05 AM  ?CMP  ?Glucose 70 - 99 mg/dL 121    ?BUN 6 - 20 mg/dL 6    ?Creatinine 0.44 - 1.00 mg/dL 0.54    ?Sodium 135 - 145 mmol/L 136    ?Potassium 3.5 - 5.1 mmol/L 3.8    ?Chloride 98 - 111 mmol/L 105    ?CO2 22 - 32 mmol/L 23    ?Calcium 8.9 - 10.3 mg/dL 8.6    ?Total Protein 6.5 - 8.1 g/dL 5.6    ?Total Bilirubin 0.3 - 1.2 mg/dL 0.5    ?Alkaline Phos 38 - 126 U/L 79    ?AST 15 - 41 U/L 15    ?ALT 0 - 44 U/L 10    ? ?Edinburgh Score: ? ?  08/17/2021  ? 12:30 PM  ?Edinburgh Postnatal Depression Scale Screening Tool  ?I have been able to laugh and see the funny side of things. 0  ?I have looked forward with enjoyment to things. 0  ?I have blamed myself unnecessarily when things went wrong. 1  ?I have been anxious or worried for no good reason. 0  ?I have felt scared or panicky for no good reason.  1  ?Things have been getting on top of me. 0  ?I have been so unhappy that I have had difficulty sleeping. 0  ?I have felt sad or miserable. 0  ?I have been so unhappy that I have been crying. 1  ?The thought of harming myself has occurred to me. 0  ?Edinburgh Postnatal Depression Scale Total 3  ? ? ? ?After visit meds:  ?Allergies as of 08/18/2021   ? ?   Reactions  ? Cymbalta [duloxetine Hcl] Other (See Comments)  ? BLACK OUT with first dose - "causes me to feel more suicidal"  ? Ultrasound Gel Hives, Rash  ? Denies airway involvement  ? ?  ? ?  ?Medication List  ?  ? ?STOP taking these medications   ? ?Accu-Chek Guide test strip ?Generic drug: glucose blood ?  ?Accu-Chek Guide w/Device Kit ?  ?Accu-Chek Softclix Lancets lancets ?  ?aspirin 81 MG chewable tablet ?  ?Blood Pressure Kit Devi ?  ?Gojji Weight Scale Misc ?  ?metFORMIN 500 MG tablet ?Commonly known as: Glucophage ?  ? ?  ? ?TAKE these medications   ? ?acetaminophen 500 MG tablet ?Commonly known as: TYLENOL ?Take 2 tablets (1,000 mg total) by mouth  every 8 (eight) hours as needed (pain). ?What changed:  ?medication strength ?how much to take ?when to take this ?reasons to take this ?  ?ferrous sulfate 325 (65 FE) MG tablet ?Take 1 tablet (325 mg total) by mouth every other day. ?Start taking on: August 19, 2021 ?  ?ibuprofen 600 MG tablet ?Commonly known as: ADVIL ?Take 1 tablet (600 mg total) by mouth every 6 (six) hours as needed. ?  ?pantoprazole 40 MG tablet ?Commonly known as: Protonix ?Take 1 tablet (40 mg total) by mouth daily. ?  ?Prenate Mini 18-0.6-0.4-350 MG Caps ?Take 1 capsule by mouth daily. ?  ? ?  ? ? ? ?Discharge home in stable condition ?Infant Feeding: Breast ?Infant Disposition:home with mother ?Discharge instruction: per After Visit Summary and Postpartum booklet. ?Activity: Advance as tolerated. Pelvic rest for 6 weeks.  ?Diet: routine diet ?Future Appointments: ?Future Appointments  ?Date Time Provider [place]  ?08/28/2021 11:30 AM Genia Harold, MD [facility] None  ?09/28/2021  3:10 PM Chancy Milroy, MD [facility] None  ? ?Follow up Visit: ?Already scheduled (see above) ? ?Please schedule this patient for a In person postpartum visit in 6 weeks with the following provider: Any provider. ?Additional Postpartum F/U: N/A   ?High risk pregnancy complicated by: GDM ?Delivery mode:  Vaginal, Spontaneous  ?Anticipated Birth Control:  IUD to be placed outpatient ? ? ?Daniel Nones, MD ?FM PGY-1 ? ?GME ATTESTATION:  ?I saw and evaluated the patient. I agree with the findings and the plan of care as documented in the resident?s note and have made all necessary edits. ? ?Renard Matter, MD, MPH ?OB Fellow, Faculty Practice ?[place] [facility] ?08/18/2021 11:35 AM ? ? ? ?

## 2021-08-19 DIAGNOSIS — Z419 Encounter for procedure for purposes other than remedying health state, unspecified: Secondary | ICD-10-CM | POA: Diagnosis not present

## 2021-08-21 ENCOUNTER — Ambulatory Visit: Payer: Medicaid Other

## 2021-08-21 ENCOUNTER — Other Ambulatory Visit: Payer: Self-pay

## 2021-08-23 ENCOUNTER — Encounter: Payer: Medicaid Other | Admitting: Obstetrics and Gynecology

## 2021-08-28 ENCOUNTER — Ambulatory Visit: Payer: Medicaid Other | Admitting: Psychiatry

## 2021-08-28 ENCOUNTER — Encounter: Payer: Self-pay | Admitting: Psychiatry
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
## 2021-08-29 ENCOUNTER — Telehealth (HOSPITAL_COMMUNITY): Payer: Self-pay

## 2021-08-29 NOTE — Telephone Encounter (Signed)
"  Doing ok." Patient declines questions or concerns about her healing.  ? ?"She's good. Eating and gaining weight. She has not stooled in a few days. I was going to call her pediatrician today. She is formula feeding. She is passing gas, her belly is soft, and no vomiting. "RN told patient that sometimes formula can be constipating for newborns. RN encouraged patient to call her pediatrician and speak with them about her concerns. "She sleeps in a bassinet." RN reviewed ABC's of safe sleep with patient. Patient declines any questions or concerns about baby. ? ?EPDS score is 9. ? ?Marcelino Duster Shuntae Herzig,RN3,MSN,RNC-MNN ?08/29/2021,1247 ?

## 2021-09-07 ENCOUNTER — Telehealth: Payer: Self-pay | Admitting: Emergency Medicine

## 2021-09-07 NOTE — Telephone Encounter (Signed)
`  Received TC from nurse for [facility]. Reports pt has Edinburgh score of-14 following PP home visit. ? ?This RN attempted call and lvm for patient to return call to office. ?

## 2021-09-14 ENCOUNTER — Other Ambulatory Visit: Payer: Self-pay | Admitting: Obstetrics

## 2021-09-14 DIAGNOSIS — O09299 Supervision of pregnancy with other poor reproductive or obstetric history, unspecified trimester: Secondary | ICD-10-CM

## 2021-09-18 DIAGNOSIS — Z419 Encounter for procedure for purposes other than remedying health state, unspecified: Secondary | ICD-10-CM | POA: Diagnosis not present

## 2021-09-23 DIAGNOSIS — F319 Bipolar disorder, unspecified: Secondary | ICD-10-CM | POA: Diagnosis not present

## 2021-09-23 DIAGNOSIS — Z888 Allergy status to other drugs, medicaments and biological substances status: Secondary | ICD-10-CM | POA: Diagnosis not present

## 2021-09-23 DIAGNOSIS — N946 Dysmenorrhea, unspecified: Secondary | ICD-10-CM | POA: Diagnosis not present

## 2021-09-23 DIAGNOSIS — Z7982 Long term (current) use of aspirin: Secondary | ICD-10-CM | POA: Diagnosis not present

## 2021-09-23 DIAGNOSIS — O909 Complication of the puerperium, unspecified: Secondary | ICD-10-CM | POA: Diagnosis not present

## 2021-09-23 DIAGNOSIS — Z79899 Other long term (current) drug therapy: Secondary | ICD-10-CM | POA: Diagnosis not present

## 2021-09-23 DIAGNOSIS — J45909 Unspecified asthma, uncomplicated: Secondary | ICD-10-CM | POA: Diagnosis not present

## 2021-09-28 ENCOUNTER — Ambulatory Visit: Payer: Medicaid Other | Admitting: Obstetrics and Gynecology

## 2021-10-19 DIAGNOSIS — Z419 Encounter for procedure for purposes other than remedying health state, unspecified: Secondary | ICD-10-CM | POA: Diagnosis not present

## 2021-10-26 ENCOUNTER — Encounter: Payer: Self-pay | Admitting: Obstetrics and Gynecology

## 2021-10-26 ENCOUNTER — Ambulatory Visit (INDEPENDENT_AMBULATORY_CARE_PROVIDER_SITE_OTHER): Payer: Medicaid Other | Admitting: Obstetrics and Gynecology

## 2021-10-26 DIAGNOSIS — O24419 Gestational diabetes mellitus in pregnancy, unspecified control: Secondary | ICD-10-CM

## 2021-10-26 DIAGNOSIS — Z30430 Encounter for insertion of intrauterine contraceptive device: Secondary | ICD-10-CM

## 2021-10-26 LAB — POCT URINE PREGNANCY: Preg Test, Ur: NEGATIVE

## 2021-10-26 MED ORDER — PARAGARD INTRAUTERINE COPPER IU IUD
1.0000 | INTRAUTERINE_SYSTEM | Freq: Once | INTRAUTERINE | Status: AC
  Administered 2021-10-26: 1 via INTRAUTERINE

## 2021-10-26 NOTE — Patient Instructions (Signed)

## 2021-10-26 NOTE — Progress Notes (Addendum)
8 weeks PP wants Paragard IUD for [facility]. UPT today is Negative  Administrations This Visit     paragard intrauterine copper IUD 1 each     Admin Date 10/26/2021 Action Given Dose 1 each Route Intrauterine Administered By Maretta Bees, RMA

## 2021-10-26 NOTE — Progress Notes (Signed)
Post Partum Visit Note  Andrea [name] is a 31 y.o. [id number] female who presents for a postpartum visit. She is 8 weeks postpartum following a normal spontaneous vaginal delivery.  I have fully reviewed the prenatal and intrapartum course. The delivery was at 37.0 gestational weeks.  Anesthesia: epidural. Postpartum course has been unremarkable. Baby is doing well. Baby is feeding by bottle - Gerber Gentle . Bleeding no bleeding. Bowel function is normal. Bladder function is normal. Patient is not sexually active. Contraception method is IUD. Postpartum depression screening: negative.   The pregnancy intention screening data noted above was reviewed. Potential methods of contraception were discussed. The patient elected to proceed with No data recorded.   Edinburgh Postnatal Depression Scale - 10/26/21 1002       Edinburgh Postnatal Depression Scale:  In the Past 7 Days   I have been able to laugh and see the funny side of things. 0    I have looked forward with enjoyment to things. 0    I have blamed myself unnecessarily when things went wrong. 0    I have been anxious or worried for no good reason. 0    I have felt scared or panicky for no good reason. 0    Things have been getting on top of me. 0    I have been so unhappy that I have had difficulty sleeping. 0    I have felt sad or miserable. 0    I have been so unhappy that I have been crying. 0    The thought of harming myself has occurred to me. 0    Edinburgh Postnatal Depression Scale Total 0             Health Maintenance Due  Topic Date Due   URINE MICROALBUMIN  Never done    The following portions of the patient's history were reviewed and updated as appropriate: allergies, current medications, past family history, past medical history, past social history, past surgical history, and problem list.  Review of Systems Pertinent items noted in HPI and remainder of comprehensive ROS otherwise negative.  Objective:   LMP 11/30/2020    General:  alert   Breasts:  not indicated  Lungs: clear to auscultation bilaterally  Heart:  regular rate and rhythm, S1, S2 normal, no murmur, click, rub or gallop  Abdomen: soft, non-tender; bowel sounds normal; no masses,  no organomegaly   Wound NA  GU exam:  normal            GYNECOLOGY CLINIC PROCEDURE NOTE   IUD Insertion Procedure Note Patient identified, informed consent performed, consent signed.   Discussed risks of irregular bleeding, cramping, infection, malpositioning or misplacement of the IUD outside the uterus which may require further procedure such as laparoscopy. Time out was performed.  Urine pregnancy test negative.  Speculum placed in the vagina.  Cervix visualized.  Cleaned with Betadine x 2.  Grasped anteriorly with a single tooth tenaculum.  Uterus sounded to 9 cm.  ParaGArd IUD placed per manufacturer's recommendations.  Strings trimmed to 3 cm. Tenaculum was removed, good hemostasis noted.  Patient tolerated procedure well.   Patient was given post-procedure instructions.  She was advised to have backup contraception for one week.     Assessment:    There are no diagnoses linked to this encounter.  Nl postpartum exam.  IUD insertion H/O A2 DM  Plan:   Essential components of care per ACOG recommendations:  1.  Mood and  well being: Patient with negative depression screening today. Reviewed local resources for support.  - Patient tobacco use? No.   - hx of drug use? No.    2. Infant care and feeding:  -Patient currently breastmilk feeding? No.  -Social determinants of health (SDOH) reviewed in EPIC.   3. Sexuality, contraception and birth spacing - Patient does not want a pregnancy in the next year.  Desired family size is Uncertain  - Reviewed reproductive life planning. Reviewed contraceptive methods based on pt preferences and effectiveness.  Patient desired IUD or IUS today.   - Discussed birth spacing of 18  months  4. Sleep and fatigue -Encouraged family/partner/community support of 4 hrs of uninterrupted sleep to help with mood and fatigue  5. Physical Recovery  - Discussed patients delivery and complications. She describes her labor as good. - Patient had a Vaginal, no problems at delivery.Perineal healing reviewed. Patient expressed understanding - Patient has urinary incontinence? No. - Patient is safe to resume physical and sexual activity  6.  Health Maintenance - HM due items addressed Yes - Last pap smear  Diagnosis  Date Value Ref Range Status  02/22/2021   Final   - Negative for intraepithelial lesion or malignancy (NILM)   Pap smear not done at today's visit.  -Breast Cancer screening indicated? No.   7. Chronic Disease/Pregnancy Condition follow up: Gestational Diabetes  Pt will schedule fasting appt for Glucola  Nettie Elm, MD [facility], [facility]

## 2021-11-02 ENCOUNTER — Other Ambulatory Visit: Payer: Medicaid Other

## 2021-11-09 ENCOUNTER — Ambulatory Visit (INDEPENDENT_AMBULATORY_CARE_PROVIDER_SITE_OTHER): Payer: Medicaid Other | Admitting: Nurse Practitioner

## 2021-11-09 ENCOUNTER — Other Ambulatory Visit (HOSPITAL_COMMUNITY): Payer: Self-pay

## 2021-11-09 VITALS — BP 118/70 | HR 71 | Temp 97.9°F | Resp 16 | Ht 61.0 in | Wt 136.2 lb

## 2021-11-09 DIAGNOSIS — E663 Overweight: Secondary | ICD-10-CM

## 2021-11-09 DIAGNOSIS — N939 Abnormal uterine and vaginal bleeding, unspecified: Secondary | ICD-10-CM

## 2021-11-09 DIAGNOSIS — R5383 Other fatigue: Secondary | ICD-10-CM | POA: Diagnosis not present

## 2021-11-09 DIAGNOSIS — F1010 Alcohol abuse, uncomplicated: Secondary | ICD-10-CM

## 2021-11-09 DIAGNOSIS — F319 Bipolar disorder, unspecified: Secondary | ICD-10-CM

## 2021-11-09 LAB — TSH: TSH: 1.85 u[IU]/mL (ref 0.35–5.50)

## 2021-11-09 LAB — LIPID PANEL
Cholesterol: 199 mg/dL (ref 0–200)
HDL: 35.9 mg/dL — ABNORMAL LOW (ref >39.00)
LDL Cholesterol: 135 mg/dL — ABNORMAL HIGH (ref 0–99)
NonHDL: 162.69
Total CHOL/HDL Ratio: 6
Triglycerides: 137 mg/dL (ref 0.0–149.0)
VLDL: 27.4 mg/dL (ref 0.0–40.0)

## 2021-11-09 LAB — COMPREHENSIVE METABOLIC PANEL
ALT: 11 U/L (ref 0–35)
AST: 11 U/L (ref 0–37)
Albumin: 3.4 g/dL — ABNORMAL LOW (ref 3.5–5.2)
Alkaline Phosphatase: 44 U/L (ref 39–117)
BUN: 13 mg/dL (ref 6–23)
CO2: 29 mEq/L (ref 19–32)
Calcium: 8.8 mg/dL (ref 8.4–10.5)
Chloride: 105 mEq/L (ref 96–112)
Creatinine, Ser: 0.74 mg/dL (ref 0.40–1.20)
GFR: 108.94 mL/min (ref >60.00)
Glucose, Bld: 93 mg/dL (ref 70–99)
Potassium: 4 mEq/L (ref 3.5–5.1)
Sodium: 138 mEq/L (ref 135–145)
Total Bilirubin: 0.3 mg/dL (ref 0.2–1.2)
Total Protein: 6.5 g/dL (ref 6.0–8.3)

## 2021-11-09 LAB — CBC
HCT: 35.6 % — ABNORMAL LOW (ref 36.0–46.0)
Hemoglobin: 11.3 g/dL — ABNORMAL LOW (ref 12.0–15.0)
MCHC: 31.6 g/dL (ref 30.0–36.0)
MCV: 75.9 fl — ABNORMAL LOW (ref 78.0–100.0)
Platelets: 453 10*3/uL — ABNORMAL HIGH (ref 150.0–400.0)
RBC: 4.69 Mil/uL (ref 3.87–5.11)
RDW: 18.9 % — ABNORMAL HIGH (ref 11.5–15.5)
WBC: 6.8 10*3/uL (ref 4.0–10.5)

## 2021-11-09 LAB — IRON: Iron: 26 ug/dL — ABNORMAL LOW (ref 42–145)

## 2021-11-09 LAB — FOLATE: Folate: 11.5 ng/mL (ref >5.9)

## 2021-11-09 LAB — FERRITIN: Ferritin: 4.5 ng/mL — ABNORMAL LOW (ref 10.0–291.0)

## 2021-11-09 LAB — T3, FREE: T3, Free: 3.3 pg/mL (ref 2.3–4.2)

## 2021-11-09 LAB — T4, FREE: Free T4: 0.98 ng/dL (ref 0.60–1.60)

## 2021-11-09 LAB — HEMOGLOBIN A1C: Hgb A1c MFr Bld: 5.4 % (ref 4.6–6.5)

## 2021-11-09 NOTE — Assessment & Plan Note (Signed)
Chronic, patient aware of her alcohol intake and would like to reduce her intake.  Referral to psychiatry for assistance for evaluation and management. Will check thiamine and folate level today as well.

## 2021-11-09 NOTE — Assessment & Plan Note (Signed)
Blood work ordered today, further recommendations may be made based on these results.

## 2021-11-09 NOTE — Assessment & Plan Note (Signed)
Blood work ordered for further evaluation, Further recommendations may be made based on these results.

## 2021-11-09 NOTE — Progress Notes (Signed)
New Patient Office Visit  Subjective    Patient ID: Andrea [name], female    DOB: Dec 04, 1990  Age: 31 y.o. MRN: [id number]  CC:  Chief Complaint  Patient presents with   Vaginal Bleeding    Pt states just having spotting now. Pt states having pain and cramping and states having a IUD on 10/26/2021.     HPI Andrea [name] presents to establish care [facility] to [place] about 5 years ago. Has not had PCP since then. She is presenting today with concerns about her mood and vaginal bleeding. She reports history of bipolar depression diagnosis with episodes of mania. She reports that she drinks alcohol to treat her mood and states she has about 9 alcoholic drinks (liquor) per day. She is almost 3 months postpartum and states she was able to abstain from alcohol during her pregnancy. She denies suicidal or homicidal ideation. She had vaginal bleeding since she was [redacted] weeks gestation throughout her pregnancy and continued to bleed until a few days ago. She reports she is very fatigued.   Outpatient Encounter Medications as of 11/09/2021  Medication Sig   ferrous sulfate 325 (65 FE) MG tablet Take 1 tablet (325 mg total) by mouth every other day.   pantoprazole (PROTONIX) 40 MG tablet Take 1 tablet (40 mg total) by mouth daily.   Prenat-FeCbn-FeAsp-Meth-FA-DHA (PRENATE MINI) 18-0.6-0.4-350 MG CAPS Take 1 capsule by mouth daily.   No facility-administered encounter medications on file as of 11/09/2021.    Past Medical History:  Diagnosis Date   Alpha thalassemia silent carrier 03/22/2021   [ ]  Needs genetic counseling, FOB testing   beta lactamase resistant e coli 07/27/2021   Bipolar depression (HCC)    Gestational diabetes    Glaucoma    Hyperlipidemia, familial, high LDL 03/07/2017   RND (reflex neurovascular dystrophy)    TMJ (dislocation of temporomandibular joint)     Past Surgical History:  Procedure Laterality Date   TONSILLECTOMY AND ADENOIDECTOMY  2003    Family History  Problem  Relation Age of Onset   Breast cancer Mother 76   Hyperlipidemia Father    Alcoholism Father    Diabetes Sister    Post-traumatic stress disorder Sister    Colon cancer Maternal Grandmother    Diabetes Mellitus II Paternal Grandmother     Social History   Socioeconomic History   Marital status: Significant Other    Spouse name: Not on file   Number of children: Not on file   Years of education: Not on file   Highest education level: Not on file  Occupational History   Occupation: [employer]   Tobacco Use   Smoking status: Never   Smokeless tobacco: Never  Vaping Use   Vaping Use: Never used  Substance and Sexual Activity   Alcohol use: Not Currently   Drug use: No   Sexual activity: Yes    Birth control/protection: None    Comment: nexplanon  Other Topics Concern   Not on file  Social History Narrative   Lives with kids and 30. Has been with Fayrene Fearing for 15 years, since 6th grade.    Social Determinants of Health   Financial Resource Strain: Not on file  Food Insecurity: No Food Insecurity (07/06/2021)   Hunger Vital Sign    Worried About Running Out of Food in the Last Year: Never true    Ran Out of Food in the Last Year: Never true  Transportation Needs: Not on file  Physical Activity: Not on  file  Stress: Not on file  Social Connections: Not on file  Intimate Partner Violence: Not on file       11/09/2021    8:40 AM 07/06/2021    8:07 PM 06/12/2021    9:36 AM  PHQ9 SCORE ONLY  PHQ-9 Total Score 9 0 11     Review of Systems  Constitutional:  Positive for malaise/fatigue.  Respiratory:  Negative for shortness of breath.   Cardiovascular:  Negative for chest pain.  Gastrointestinal:  Negative for abdominal pain.  Psychiatric/Behavioral:  Positive for depression. Negative for suicidal ideas ((-) homicidal ideation).         Objective    BP 118/70 (BP Location: Left Arm, Patient Position: Sitting, Cuff Size: Normal)   Pulse 71   Temp 97.9 F (36.6  C) (Oral)   Resp 16   Ht 5\' 1"  (1.549 m)   Wt 136 lb 3.2 oz (61.8 kg)   SpO2 99%   BMI 25.73 kg/m   Physical Exam Vitals reviewed.  Constitutional:      General: She is not in acute distress.    Appearance: Normal appearance.  HENT:     Head: Normocephalic and atraumatic.  Neck:     Vascular: No carotid bruit.  Cardiovascular:     Rate and Rhythm: Normal rate and regular rhythm.     Pulses: Normal pulses.     Heart sounds: Normal heart sounds.  Pulmonary:     Effort: Pulmonary effort is normal.     Breath sounds: Normal breath sounds.  Skin:    General: Skin is warm and dry.  Neurological:     General: No focal deficit present.     Mental Status: She is alert and oriented to person, place, and time.  Psychiatric:        Mood and Affect: Mood normal.        Behavior: Behavior normal.        Judgment: Judgment normal.         Assessment & Plan:   Problem List Items Addressed This Visit       Other   Alcohol abuse    Chronic, patient aware of her alcohol intake and would like to reduce her intake.  Referral to psychiatry for assistance for evaluation and management. Will check thiamine and folate level today as well.       Relevant Orders   Ambulatory referral to Psychiatry   Folate   Vitamin B1   Bipolar depression (HCC) - Primary    Chronic, not controlled. Referral to psychiatric services ordered today. Blood work also ordered for further evaluation. Further recommendations may be made based on these results.        Relevant Orders   TSH   Hemoglobin A1c   Lipid panel   Comprehensive metabolic panel   CBC   Ambulatory referral to Psychiatry   Folate   Vitamin B1   T3, free   T4, free   Fatigue    Blood work ordered today, further recommendations may be made based on these results.       Relevant Orders   TSH   Hemoglobin A1c   Lipid panel   Comprehensive metabolic panel   CBC   Ambulatory referral to Psychiatry   Folate   Vitamin B1    T3, free   T4, free   Vaginal bleeding    Appears to have stopped for now. Will check CBC may consider vaginal ultrasound and referral back to  OBGYN for further evaluation.       Relevant Orders   TSH   Hemoglobin A1c   Lipid panel   Comprehensive metabolic panel   CBC   Folate   Vitamin B1   Overweight    Blood work ordered for further evaluation, Further recommendations may be made based on these results.        Relevant Orders   TSH   Hemoglobin A1c   Lipid panel   Comprehensive metabolic panel   CBC   Folate   Vitamin B1   T3, free   T4, free    Return in about 1 month (around 12/09/2021) for Follow-up with Maralyn Sago.   Elenore Paddy, NP

## 2021-11-09 NOTE — Addendum Note (Signed)
Addended by: Elenore Paddy on: 11/09/2021 09:25 AM   Modules accepted: Orders

## 2021-11-09 NOTE — Assessment & Plan Note (Signed)
Chronic, not controlled. Referral to psychiatric services ordered today. Blood work also ordered for further evaluation. Further recommendations may be made based on these results.

## 2021-11-09 NOTE — Assessment & Plan Note (Signed)
Appears to have stopped for now. Will check CBC may consider vaginal ultrasound and referral back to OBGYN for further evaluation.

## 2021-11-15 LAB — VITAMIN B1: Vitamin B1 (Thiamine): 8 nmol/L (ref 8–30)

## 2021-11-18 DIAGNOSIS — Z419 Encounter for procedure for purposes other than remedying health state, unspecified: Secondary | ICD-10-CM | POA: Diagnosis not present

## 2021-12-14 ENCOUNTER — Telehealth: Payer: Self-pay | Admitting: Nurse Practitioner

## 2021-12-14 ENCOUNTER — Other Ambulatory Visit (HOSPITAL_COMMUNITY): Payer: Self-pay

## 2021-12-14 ENCOUNTER — Ambulatory Visit (INDEPENDENT_AMBULATORY_CARE_PROVIDER_SITE_OTHER): Payer: Medicaid Other | Admitting: Nurse Practitioner

## 2021-12-14 ENCOUNTER — Other Ambulatory Visit: Payer: Self-pay | Admitting: Nurse Practitioner

## 2021-12-14 VITALS — BP 122/68 | HR 66 | Temp 97.9°F | Ht 67.0 in | Wt 133.0 lb

## 2021-12-14 DIAGNOSIS — N939 Abnormal uterine and vaginal bleeding, unspecified: Secondary | ICD-10-CM | POA: Diagnosis not present

## 2021-12-14 DIAGNOSIS — D509 Iron deficiency anemia, unspecified: Secondary | ICD-10-CM | POA: Diagnosis not present

## 2021-12-14 DIAGNOSIS — F319 Bipolar disorder, unspecified: Secondary | ICD-10-CM | POA: Diagnosis not present

## 2021-12-14 LAB — CBC
HCT: 35.4 % — ABNORMAL LOW (ref 36.0–46.0)
Hemoglobin: 11.3 g/dL — ABNORMAL LOW (ref 12.0–15.0)
MCHC: 31.8 g/dL (ref 30.0–36.0)
MCV: 74.3 fl — ABNORMAL LOW (ref 78.0–100.0)
Platelets: 580 10*3/uL — ABNORMAL HIGH (ref 150.0–400.0)
RBC: 4.77 Mil/uL (ref 3.87–5.11)
RDW: 16.1 % — ABNORMAL HIGH (ref 11.5–15.5)
WBC: 7.4 10*3/uL (ref 4.0–10.5)

## 2021-12-14 LAB — IRON: Iron: 15 ug/dL — ABNORMAL LOW (ref 42–145)

## 2021-12-14 LAB — FERRITIN: Ferritin: 5.7 ng/mL — ABNORMAL LOW (ref 10.0–291.0)

## 2021-12-14 MED ORDER — HYDROXYZINE PAMOATE 25 MG PO CAPS: 25.0000 mg | ORAL_CAPSULE | Freq: Three times a day (TID) | ORAL | 2 refills | Status: AC | PRN

## 2021-12-14 MED ORDER — FERROUS SULFATE 325 (65 FE) MG PO TABS: 325.0000 mg | ORAL_TABLET | Freq: Two times a day (BID) | ORAL | 3 refills | Status: AC
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
## 2021-12-14 NOTE — Assessment & Plan Note (Signed)
Urgent referral made to OB/GYN today as well as we will check serum hCG test to rule out pregnancy as patient is unable to feel strings of IUD currently.

## 2021-12-14 NOTE — Telephone Encounter (Signed)
Can we look into referral to psychiatry for this patient? She has not heard from the psychiatry office as of yet. I tried to find a phone number to provide to patient to call however I am not sure which office the referral was sent to. If we can provide the patient with phone number to assist her so she can call them that would be great. Thank you.

## 2021-12-14 NOTE — Assessment & Plan Note (Signed)
Patient to continue taking her iron supplement, will recheck CBC, ferritin, and iron level today.

## 2021-12-14 NOTE — Patient Instructions (Addendum)
For Immediate assistance for mental health assistance proceed to:  [facility] [address]  [phone]
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
## 2021-12-15 LAB — HCG, SERUM, QUALITATIVE: Preg, Serum: NEGATIVE

## 2021-12-19 DIAGNOSIS — Z419 Encounter for procedure for purposes other than remedying health state, unspecified: Secondary | ICD-10-CM | POA: Diagnosis not present

## 2021-12-21 NOTE — Telephone Encounter (Signed)
Unable to reach patient regarding referral, left message for a call back.

## 2021-12-25 DIAGNOSIS — H5213 Myopia, bilateral: Secondary | ICD-10-CM | POA: Diagnosis not present

## 2022-01-04 ENCOUNTER — Telehealth: Payer: Self-pay

## 2022-01-04 ENCOUNTER — Encounter: Payer: Self-pay | Admitting: Nurse Practitioner

## 2022-01-04 NOTE — Telephone Encounter (Signed)
Pt states she remembers that Cholesterol was supposed to be discuss when she came back on 12/14/21 but she forgot to ask. Pt is wanting to know will she need to be on medication for the mildly elevated cholesterol.  Please advise

## 2022-01-04 NOTE — Telephone Encounter (Signed)
Spoke with patient and she states that you all did not discuss her high cholesterol at her last visit. Does she require a visit or could you make a note and I can go over it with her.

## 2022-01-04 NOTE — Telephone Encounter (Signed)
Great thank you!

## 2022-01-04 NOTE — Telephone Encounter (Addendum)
Pt called in for the number for [facility]  on [address] [phone].

## 2022-01-18 ENCOUNTER — Telehealth: Payer: Self-pay

## 2022-01-18 NOTE — Telephone Encounter (Signed)
Called and left voice mail in regards to her question for a lab results

## 2022-01-19 DIAGNOSIS — Z419 Encounter for procedure for purposes other than remedying health state, unspecified: Secondary | ICD-10-CM | POA: Diagnosis not present

## 2022-02-05 DIAGNOSIS — Z299 Encounter for prophylactic measures, unspecified: Secondary | ICD-10-CM | POA: Diagnosis not present

## 2022-02-05 DIAGNOSIS — K047 Periapical abscess without sinus: Secondary | ICD-10-CM | POA: Diagnosis not present

## 2022-02-13 ENCOUNTER — Ambulatory Visit (INDEPENDENT_AMBULATORY_CARE_PROVIDER_SITE_OTHER): Payer: Medicaid Other | Admitting: Family Medicine

## 2022-02-13 ENCOUNTER — Encounter: Payer: Self-pay | Admitting: Family Medicine

## 2022-02-13 VITALS — BP 108/62 | HR 79 | Temp 97.6°F | Ht 67.0 in | Wt 130.0 lb

## 2022-02-13 DIAGNOSIS — T3695XA Adverse effect of unspecified systemic antibiotic, initial encounter: Secondary | ICD-10-CM

## 2022-02-13 DIAGNOSIS — N898 Other specified noninflammatory disorders of vagina: Secondary | ICD-10-CM

## 2022-02-13 NOTE — Progress Notes (Signed)
Subjective:     Patient ID: Andrea [name], female    DOB: October 28, 1990, 31 y.o.   MRN: [id number]  Chief Complaint  Patient presents with   Vaginal Itching    Was given antibiotic for tooth abscess removal last Tuesday, vaginal itching started Friday but states every time she takes the antibiotic her whole body itches as well.     HPI Patient is in today for vaginal itching after being on clindamycin for tooth abscess. She took 9 tablets and stopped 5 days ago due to itching "all over" after being on the antibiotic. Itching has improved. No rash.  She took Benadryl. No tongue or throat swelling.   She was prescribed fluconazole 2 tablets.   She saw a dentist and had the tooth removed.   Denies fever, chills, dizziness, chest pain, palpitations, shortness of breath, abdominal pain, N/V/D.     There are no preventive care reminders to display for this patient.   Past Medical History:  Diagnosis Date   Alpha thalassemia silent carrier 03/22/2021   [ ]  Needs genetic counseling, FOB testing   beta lactamase resistant e coli 07/27/2021   Bipolar depression (HCC)    Gestational diabetes    Glaucoma    Hyperlipidemia, familial, high LDL 03/07/2017   RND (reflex neurovascular dystrophy)    TMJ (dislocation of temporomandibular joint)     Past Surgical History:  Procedure Laterality Date   TONSILLECTOMY AND ADENOIDECTOMY  2003    Family History  Problem Relation Age of Onset   Breast cancer Mother 26   Hyperlipidemia Father    Alcoholism Father    Diabetes Sister    Post-traumatic stress disorder Sister    Colon cancer Maternal Grandmother    Diabetes Mellitus II Paternal Grandmother     Social History   Socioeconomic History   Marital status: Significant Other    Spouse name: Not on file   Number of children: Not on file   Years of education: Not on file   Highest education level: Not on file  Occupational History   Occupation: [employer]   Tobacco Use   Smoking  status: Never   Smokeless tobacco: Never  Vaping Use   Vaping Use: Never used  Substance and Sexual Activity   Alcohol use: Not Currently   Drug use: No   Sexual activity: Yes    Birth control/protection: None    Comment: nexplanon  Other Topics Concern   Not on file  Social History Narrative   Lives with kids and 30. Has been with Fayrene Fearing for 15 years, since 6th grade.    Social Determinants of Health   Financial Resource Strain: Not on file  Food Insecurity: No Food Insecurity (07/06/2021)   Hunger Vital Sign    Worried About Running Out of Food in the Last Year: Never true    Ran Out of Food in the Last Year: Never true  Transportation Needs: Not on file  Physical Activity: Not on file  Stress: Not on file  Social Connections: Not on file  Intimate Partner Violence: Not on file    Outpatient Medications Prior to Visit  Medication Sig Dispense Refill   ferrous sulfate 325 (65 FE) MG tablet Take 1 tablet (325 mg total) by mouth 2 (two) times daily with a meal. 30 tablet 3   hydrOXYzine (VISTARIL) 25 MG capsule Take 1-2 capsules (25-50 mg total) by mouth every 8 (eight) hours as needed. 30 capsule 2   Prenat-FeCbn-FeAsp-Meth-FA-DHA (PRENATE MINI) 18-0.6-0.4-350 MG  CAPS Take 1 capsule by mouth daily. 30 capsule 11   pantoprazole (PROTONIX) 40 MG tablet Take 1 tablet (40 mg total) by mouth daily. (Patient not taking: Reported on 02/13/2022) 30 tablet 11   clindamycin (CLEOCIN) 150 MG capsule Take 150 mg by mouth 3 (three) times daily. (Patient not taking: Reported on 02/13/2022)     No facility-administered medications prior to visit.    Allergies  Allergen Reactions   Cymbalta [Duloxetine Hcl] Other (See Comments)    BLACK OUT with first dose - "causes me to feel more suicidal"   Duloxetine Other (See Comments)    BLACK OUT with first dose - "causes me to feel more suicidal" Severe nausea, dizziness Severe nausea, dizziness   Clindamycin/Lincomycin Itching    Itching all  over including throat    Ultrasound Gel Hives and Rash    Denies airway involvement    ROS     Objective:    Physical Exam  BP 108/62 (BP Location: Left Arm, Patient Position: Sitting, Cuff Size: Large)   Pulse 79   Temp 97.6 F (36.4 C) (Temporal)   Ht 5\' 7"  (1.702 m)   Wt 130 lb (59 kg)   LMP 02/07/2022   SpO2 99%   BMI 20.36 kg/m  Wt Readings from Last 3 Encounters:  02/13/22 130 lb (59 kg)  12/14/21 133 lb (60.3 kg)  11/09/21 136 lb 3.2 oz (61.8 kg)   Alert and in no distress. Pharyngeal area is normal. Neck is supple without adenopathy or thyromegaly. Cardiac exam shows a regular sinus rhythm without murmurs or gallops. Lungs are clear to auscultation. Skin is warm and dry. No rash. PERRLA, EOMs intact. Normal speech and mood.      Assessment & Plan:   Problem List Items Addressed This Visit   None Visit Diagnoses     Vaginal itching    -  Primary   Allergic reaction due to antibacterial drug          She stopped the clindamycin 5 days ago and itching all over has resolved.  She had the abscessed tooth removed.  Vaginal itching persists.  She was prescribed Diflucan along with the clindamycin from urgent care but she was not aware that this medication was at her pharmacy.  She will let me know if she is unable to pick up Diflucan. I did list clindamycin as an allergy for her since this is happened on more than one occasion. Follow-up with PCP or here as needed.  I have discontinued Sundi Cleaves's clindamycin. I am also having her maintain her Prenate Mini, pantoprazole, hydrOXYzine, and ferrous sulfate.  No orders of the defined types were placed in this encounter.

## 2022-02-13 NOTE — Patient Instructions (Signed)
Pick up the fluconazole (Diflucan) tablets for yeast infection at your pharmacy.  Let me know if you have any trouble getting these.  Follow-up if the medications do not take care of the itching.  I have listed clindamycin as an allergy in your medical record.  Follow-up with your primary care provider, Jeralyn Ruths, NP, as needed.

## 2022-02-18 DIAGNOSIS — Z419 Encounter for procedure for purposes other than remedying health state, unspecified: Secondary | ICD-10-CM | POA: Diagnosis not present

## 2022-03-05 ENCOUNTER — Ambulatory Visit (INDEPENDENT_AMBULATORY_CARE_PROVIDER_SITE_OTHER): Payer: Medicaid Other | Admitting: Emergency Medicine

## 2022-03-05 ENCOUNTER — Encounter: Payer: Self-pay | Admitting: Emergency Medicine

## 2022-03-05 VITALS — BP 112/80 | HR 84 | Temp 98.3°F | Ht 67.0 in | Wt 134.0 lb

## 2022-03-05 DIAGNOSIS — R051 Acute cough: Secondary | ICD-10-CM | POA: Diagnosis not present

## 2022-03-05 DIAGNOSIS — J22 Unspecified acute lower respiratory infection: Secondary | ICD-10-CM | POA: Diagnosis not present

## 2022-03-05 DIAGNOSIS — J4520 Mild intermittent asthma, uncomplicated: Secondary | ICD-10-CM | POA: Diagnosis not present

## 2022-03-05 MED ORDER — HYDROCODONE BIT-HOMATROP MBR 5-1.5 MG/5ML PO SOLN: 5.0000 mL | Freq: Every evening | ORAL | 0 refills | Status: DC | PRN

## 2022-03-05 MED ORDER — AZITHROMYCIN 250 MG PO TABS: ORAL_TABLET | ORAL | 0 refills | Status: DC

## 2022-03-05 MED ORDER — BENZONATATE 200 MG PO CAPS: 200.0000 mg | ORAL_CAPSULE | Freq: Two times a day (BID) | ORAL | 0 refills | Status: DC | PRN

## 2022-03-05 NOTE — Progress Notes (Signed)
Andrea [name] 31 y.o.   Chief Complaint  Patient presents with   possible ear infection   Cough    Coughing up green mucus, SOB , symptoms started last Wednesday     HISTORY OF PRESENT ILLNESS: Acute problem visit today. This is a 31 y.o. female complaining of flulike symptoms that started 5 days ago. Non-smoker.  Mostly complaining of productive cough with bilateral ear congestion. 16-month-old daughter also recently diagnosed with a respiratory viral infection and ear infection.  Started on Augmentin. Lack of appetite and energy.  Denies fever or chills.  Denies chest pain or difficulty breathing. Denies nausea or vomiting.  Able to eat and drink. No other complaints or medical concerns today.  Cough Associated symptoms include ear pain. Pertinent negatives include no chest pain, chills, fever, headaches, sore throat, shortness of breath or wheezing.     Prior to Admission medications   Medication Sig Start Date End Date Taking? Authorizing Provider  ferrous sulfate 325 (65 FE) MG tablet Take 1 tablet (325 mg total) by mouth 2 (two) times daily with a meal. 12/14/21  Yes Elenore Paddy, NP  hydrOXYzine (VISTARIL) 25 MG capsule Take 1-2 capsules (25-50 mg total) by mouth every 8 (eight) hours as needed. 12/14/21  Yes Elenore Paddy, NP  Prenat-FeCbn-FeAsp-Meth-FA-DHA (PRENATE MINI) 18-0.6-0.4-350 MG CAPS Take 1 capsule by mouth daily. 02/22/21  Yes Brock Bad, MD    Allergies  Allergen Reactions   Cymbalta [Duloxetine Hcl] Other (See Comments)    BLACK OUT with first dose - "causes me to feel more suicidal"   Duloxetine Other (See Comments)    BLACK OUT with first dose - "causes me to feel more suicidal" Severe nausea, dizziness Severe nausea, dizziness   Clindamycin/Lincomycin Itching    Itching all over including throat    Ultrasound Gel Hives and Rash    Denies airway involvement    Patient Active Problem List   Diagnosis Date Noted   Iron deficiency anemia  12/14/2021   Bipolar depression (HCC) 11/09/2021   Fatigue 11/09/2021   Vaginal bleeding 11/09/2021   Overweight 11/09/2021   Postpartum care following vaginal delivery 10/26/2021   Encounter for insertion of copper IUD 10/26/2021   GDM, class A2 08/16/2021   History of placental abruption 03/22/2021   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 03/22/2021   Glaucoma 02/13/2021   History of opioid abuse (HCC) 01/09/2019   Alcohol abuse 01/09/2019   Bipolar 1 disorder (HCC) 03/07/2017   Migraine with aura and without status migrainosus, not intractable 07/23/2014   Recurrent depression (HCC) 04/14/2012   Asthma, intermittent 08/23/2009    Past Medical History:  Diagnosis Date   Alpha thalassemia silent carrier 03/22/2021   [ ]  Needs genetic counseling, FOB testing   beta lactamase resistant e coli 07/27/2021   Bipolar depression (HCC)    Gestational diabetes    Glaucoma    Hyperlipidemia, familial, high LDL 03/07/2017   RND (reflex neurovascular dystrophy)    TMJ (dislocation of temporomandibular joint)     Past Surgical History:  Procedure Laterality Date   TONSILLECTOMY AND ADENOIDECTOMY  2003    Social History   Socioeconomic History   Marital status: Significant Other    Spouse name: Not on file   Number of children: Not on file   Years of education: Not on file   Highest education level: Not on file  Occupational History   Occupation: [employer]   Tobacco Use   Smoking status: Never   Smokeless  tobacco: Never  Vaping Use   Vaping Use: Never used  Substance and Sexual Activity   Alcohol use: Not Currently   Drug use: No   Sexual activity: Yes    Birth control/protection: None    Comment: nexplanon  Other Topics Concern   Not on file  Social History Narrative   Lives with kids and Jeneen Rinks. Has been with Jeneen Rinks for 15 years, since 6th grade.    Social Determinants of Health   Financial Resource Strain: Not on file  Food Insecurity: No Food Insecurity (07/06/2021)    Hunger Vital Sign    Worried About Running Out of Food in the Last Year: Never true    Ran Out of Food in the Last Year: Never true  Transportation Needs: Not on file  Physical Activity: Not on file  Stress: Not on file  Social Connections: Not on file  Intimate Partner Violence: Not on file    Family History  Problem Relation Age of Onset   Breast cancer Mother 56   Hyperlipidemia Father    Alcoholism Father    Diabetes Sister    Post-traumatic stress disorder Sister    Colon cancer Maternal Grandmother    Diabetes Mellitus II Paternal Grandmother      Review of Systems  Constitutional:  Positive for malaise/fatigue. Negative for chills and fever.  HENT:  Positive for congestion and ear pain. Negative for sore throat.   Respiratory:  Positive for cough and sputum production. Negative for shortness of breath and wheezing.   Cardiovascular: Negative.  Negative for chest pain and palpitations.  Gastrointestinal:  Negative for abdominal pain, diarrhea, nausea and vomiting.  Genitourinary: Negative.   Musculoskeletal: Negative.   Neurological: Negative.  Negative for dizziness and headaches.  All other systems reviewed and are negative.  Today's Vitals   03/05/22 1107  BP: 112/80  Pulse: 84  Temp: 98.3 F (36.8 C)  TempSrc: Oral  SpO2: 98%  Weight: 134 lb (60.8 kg)  Height: 5\' 7"  (1.702 m)   Body mass index is 20.99 kg/m.   Physical Exam Vitals reviewed.  Constitutional:      Appearance: Normal appearance.  HENT:     Head: Normocephalic.     Right Ear: Tympanic membrane, ear canal and external ear normal.     Left Ear: Tympanic membrane, ear canal and external ear normal.     Mouth/Throat:     Mouth: Mucous membranes are moist.     Pharynx: Oropharynx is clear.  Eyes:     Extraocular Movements: Extraocular movements intact.     Conjunctiva/sclera: Conjunctivae normal.     Pupils: Pupils are equal, round, and reactive to light.  Cardiovascular:     Rate and  Rhythm: Normal rate and regular rhythm.     Pulses: Normal pulses.     Heart sounds: Normal heart sounds.  Pulmonary:     Effort: Pulmonary effort is normal.     Breath sounds: Normal breath sounds.  Musculoskeletal:     Cervical back: No tenderness.     Right lower leg: No edema.     Left lower leg: No edema.  Lymphadenopathy:     Cervical: No cervical adenopathy.  Skin:    General: Skin is warm and dry.  Neurological:     General: No focal deficit present.     Mental Status: She is alert and oriented to person, place, and time.  Psychiatric:        [name] and Affect: [name] normal.  Behavior: Behavior normal.      ASSESSMENT & PLAN: Problem List Items Addressed This Visit       Respiratory   Asthma, intermittent    Stable.  No wheezing on physical exam.      Lower respiratory infection - Primary    Viral respiratory infection now with secondary bacterial infection Will benefit from azithromycin daily for 5 days. No signs of pneumonia.      Relevant Medications   azithromycin (ZITHROMAX) 250 MG tablet     Other   Acute cough    Continue over-the-counter Mucinex DM. Start Tessalon 200 mg 3 times a day Hycodan syrup for bedtime Cough drops as needed Advised to stay well-hydrated and rest as much as possible.      Relevant Medications   benzonatate (TESSALON) 200 MG capsule   HYDROcodone bit-homatropine (HYCODAN) 5-1.5 MG/5ML syrup   Patient Instructions  Cough, Adult A cough helps to clear your throat and lungs. A cough may be a sign of an illness or another medical condition. An acute cough may only last 2-3 weeks, while a chronic cough may last 8 or more weeks. Many things can cause a cough. They include: Germs (viruses or bacteria) that attack the airway. Breathing in things that bother (irritate) your lungs. Allergies. Asthma. Mucus that runs down the back of your throat (postnasal drip). Smoking. Acid backing up from the stomach into the tube  that moves food from the mouth to the stomach (gastroesophageal reflux). Some medicines. Lung problems. Other medical conditions, such as heart failure or a blood clot in the lung (pulmonary embolism). Follow these instructions at home: Medicines Take over-the-counter and prescription medicines only as told by your doctor. Talk with your doctor before you take medicines that stop a cough (cough suppressants). Lifestyle  Do not smoke, and try not to be around smoke. Do not use any products that contain nicotine or tobacco, such as cigarettes, e-cigarettes, and chewing tobacco. If you need help quitting, ask your doctor. Drink enough fluid to keep your pee (urine) pale yellow. Avoid caffeine. Do not drink alcohol if your doctor tells you not to drink. General instructions  Watch for any changes in your cough. Tell your doctor about them. Always cover your mouth when you cough. Stay away from things that make you cough, such as perfume, candles, campfire smoke, or cleaning products. If the air is dry, use a cool mist vaporizer or humidifier in your home. If your cough is worse at night, try using extra pillows to raise your head up higher while you sleep. Rest as needed. Keep all follow-up visits as told by your doctor. This is important. Contact a doctor if: You have new symptoms. You cough up pus. Your cough does not get better after 2-3 weeks, or your cough gets worse. Cough medicine does not help your cough and you are not sleeping well. You have pain that gets worse or pain that is not helped with medicine. You have a fever. You are losing weight and you do not know why. You have night sweats. Get help right away if: You cough up blood. You have trouble breathing. Your heartbeat is very fast. These symptoms may be an emergency. Do not wait to see if the symptoms will go away. Get medical help right away. Call your local emergency services (911 in the [place] Do not drive yourself  to the hospital. Summary A cough helps to clear your throat and lungs. Many things can cause a cough.  Take over-the-counter and prescription medicines only as told by your doctor. Always cover your mouth when you cough. Contact a doctor if you have new symptoms or you have a cough that does not get better or gets worse. This information is not intended to replace advice given to you by your health care provider. Make sure you discuss any questions you have with your health care provider. Document Revised: 06/26/2019 Document Reviewed: 05/26/2018 Elsevier Patient Education  2023 Elsevier Inc.     Edwina Barth, MD [facility]

## 2022-03-05 NOTE — Assessment & Plan Note (Signed)
Stable.  No wheezing on physical exam.

## 2022-03-05 NOTE — Assessment & Plan Note (Signed)
Viral respiratory infection now with secondary bacterial infection Will benefit from azithromycin daily for 5 days. No signs of pneumonia.

## 2022-03-05 NOTE — Assessment & Plan Note (Signed)
Continue over-the-counter Mucinex DM. Start Tessalon 200 mg 3 times a day Hycodan syrup for bedtime Cough drops as needed Advised to stay well-hydrated and rest as much as possible.

## 2022-03-05 NOTE — Patient Instructions (Signed)

## 2022-03-15 ENCOUNTER — Ambulatory Visit (INDEPENDENT_AMBULATORY_CARE_PROVIDER_SITE_OTHER): Payer: Medicaid Other | Admitting: Nurse Practitioner

## 2022-03-15 VITALS — BP 122/78 | HR 76 | Temp 97.8°F | Ht 67.0 in | Wt 130.2 lb

## 2022-03-15 DIAGNOSIS — D509 Iron deficiency anemia, unspecified: Secondary | ICD-10-CM | POA: Diagnosis not present

## 2022-03-15 DIAGNOSIS — Z0001 Encounter for general adult medical examination with abnormal findings: Secondary | ICD-10-CM | POA: Diagnosis not present

## 2022-03-15 DIAGNOSIS — F319 Bipolar disorder, unspecified: Secondary | ICD-10-CM | POA: Diagnosis not present

## 2022-03-15 DIAGNOSIS — G2581 Restless legs syndrome: Secondary | ICD-10-CM

## 2022-03-15 DIAGNOSIS — F331 Major depressive disorder, recurrent, moderate: Secondary | ICD-10-CM | POA: Diagnosis not present

## 2022-03-15 LAB — FERRITIN: Ferritin: 4.1 ng/mL — ABNORMAL LOW (ref 10.0–291.0)

## 2022-03-15 LAB — FOLATE: Folate: 17.8 ng/mL (ref >5.9)

## 2022-03-15 LAB — VITAMIN B12: Vitamin B-12: 274 pg/mL (ref 211–911)

## 2022-03-15 MED ORDER — PRENATE MINI 18-0.6-0.4-350 MG PO CAPS: 1.0000 | ORAL_CAPSULE | Freq: Every day | ORAL | 11 refills | Status: AC

## 2022-03-15 MED ORDER — GABAPENTIN 100 MG PO CAPS: 100.0000 mg | ORAL_CAPSULE | Freq: Every day | ORAL | 2 refills | Status: AC

## 2022-03-15 NOTE — Assessment & Plan Note (Signed)
Overall exam looks good.  Recommend patient continue living healthy lifestyle.  Encouraged her to follow-up with OB/GYN as scheduled for routine Pap smears.  Will not administer flu shot per patient request.  Information sheet regarding preventative health care provided to patient today.

## 2022-03-15 NOTE — Assessment & Plan Note (Addendum)
Chronic, will trial gabapentin 100 mg by mouth at bedtime.  Patient counseled to discuss discontinue medication prior to removal of IUD or if she has unplanned pregnancy she needs stop medication.  Patient reports understanding.

## 2022-03-15 NOTE — Assessment & Plan Note (Signed)
Chronic, patient congratulated on sobriety.  Patient encouraged to follow-up with psychiatry today as scheduled.  Patient reports understanding.

## 2022-03-15 NOTE — Progress Notes (Signed)
Established Patient Office Visit  Subjective   Patient ID: Andrea [name], female    DOB: May 07, 1991  Age: 31 y.o. MRN: [id number]  No chief complaint on file.   Here for annual exam.  Declines desire for flu shot today, up-to-date with Pap smear.  Has been screened for hepatitis C and HIV. Has anemia, increased iron supplement to twice a day since last office visit.  Due to have iron levels checked again today. Is scheduled to see psychiatry today, has history of bipolar disorder as well as substance use disorder.  Reports that she has been sober from alcohol intake for a little over 2 months. Reports that she has history of restless leg syndrome, tells me she has sensation to fidget her legs at night and feels that she has involuntary movement of her legs at night.  Reports history of having been on gabapentin 300 mg capsules which caused somnolence.    Review of Systems  Constitutional:  Negative for fever.  Respiratory:  Negative for shortness of breath.   Cardiovascular:  Negative for chest pain.  Gastrointestinal:  Negative for abdominal pain.  Psychiatric/Behavioral:  The patient has insomnia.       Objective:     BP 122/78   Pulse 76   Temp 97.8 F (36.6 C) (Oral)   Ht 5\' 7"  (1.702 m)   Wt 130 lb 4 oz (59.1 kg)   LMP 02/07/2022   SpO2 95%   BMI 20.40 kg/m    Physical Exam Vitals reviewed. Exam conducted with a chaperone present.  Constitutional:      Appearance: Normal appearance.  HENT:     Head: Normocephalic and atraumatic.     Right Ear: Tympanic membrane, ear canal and external ear normal.     Left Ear: Tympanic membrane, ear canal and external ear normal.  Eyes:     General:        Right eye: No discharge.        Left eye: No discharge.     Extraocular Movements: Extraocular movements intact.     Conjunctiva/sclera: Conjunctivae normal.     Pupils: Pupils are equal, round, and reactive to light.  Neck:     Vascular: No carotid bruit.   Cardiovascular:     Rate and Rhythm: Normal rate and regular rhythm.     Pulses: Normal pulses.     Heart sounds: Normal heart sounds. No murmur heard. Pulmonary:     Effort: Pulmonary effort is normal.     Breath sounds: Normal breath sounds.  Chest:  Breasts:    Breasts are symmetrical.     Right: Normal. No inverted nipple, mass or nipple discharge.     Left: Normal. No inverted nipple, mass or nipple discharge.  Abdominal:     General: Abdomen is flat. Bowel sounds are normal. There is no distension.     Palpations: Abdomen is soft. There is no mass.     Tenderness: There is no abdominal tenderness.  Musculoskeletal:        General: No tenderness.     Cervical back: Neck supple. No muscular tenderness.     Right lower leg: No edema.     Left lower leg: No edema.  Lymphadenopathy:     Cervical: No cervical adenopathy.     Upper Body:     Right upper body: No supraclavicular adenopathy.     Left upper body: No supraclavicular adenopathy.  Skin:    General: Skin is warm and dry.  Neurological:     General: No focal deficit present.     Mental Status: She is alert and oriented to person, place, and time.     Motor: No weakness.     Gait: Gait normal.  Psychiatric:        Mood and Affect: Mood normal.        Behavior: Behavior normal.        Judgment: Judgment normal.      No results found for any visits on 03/15/22.    The ASCVD Risk score (Arnett DK, et al., 2019) failed to calculate for the following reasons:   The 2019 ASCVD risk score is only valid for ages 45 to 39    Assessment & Plan:   Problem List Items Addressed This Visit       Other   Iron deficiency anemia    Chronic, will check anemia panel today.  Further recommendations may be made based upon his results.      Relevant Medications   Prenat-FeCbn-FeAsp-Meth-FA-DHA (PRENATE MINI) 18-0.6-0.4-350 MG CAPS   Other Relevant Orders   Vitamin B12   Folate   Iron and TIBC   Ferritin   Restless  leg syndrome    Chronic, will trial gabapentin 100 mg by mouth at bedtime.  Patient counseled to discuss discontinue medication prior to removal of IUD or if she has unplanned pregnancy she needs stop medication.  Patient reports understanding.      Relevant Medications   gabapentin (NEURONTIN) 100 MG capsule   Encounter for general adult medical examination with abnormal findings - Primary    Overall exam looks good.  Recommend patient continue living healthy lifestyle.  Encouraged her to follow-up with OB/GYN as scheduled for routine Pap smears.  Will not administer flu shot per patient request.  Information sheet regarding preventative health care provided to patient today.       Return in about 6 months (around 09/14/2022) for f/u with Maralyn Sago.    Elenore Paddy, NP

## 2022-03-15 NOTE — Assessment & Plan Note (Signed)
Chronic, will check anemia panel today.  Further recommendations may be made based upon his results.

## 2022-03-16 ENCOUNTER — Other Ambulatory Visit: Payer: Self-pay | Admitting: Nurse Practitioner

## 2022-03-16 ENCOUNTER — Telehealth: Payer: Self-pay | Admitting: Hematology

## 2022-03-16 DIAGNOSIS — D509 Iron deficiency anemia, unspecified: Secondary | ICD-10-CM

## 2022-03-16 DIAGNOSIS — D75839 Thrombocytosis, unspecified: Secondary | ICD-10-CM

## 2022-03-16 LAB — IRON AND TIBC
Iron Saturation: 30 % (ref 15–55)
Iron: 80 ug/dL (ref 27–159)
Total Iron Binding Capacity: 263 ug/dL (ref 250–450)
UIBC: 183 ug/dL (ref 131–425)

## 2022-03-16 NOTE — Progress Notes (Signed)
Hematology referral

## 2022-03-16 NOTE — Telephone Encounter (Signed)
Scheduled appt per 10/27 referral. Pt is aware of appt date and time. Pt is aware to arrive 15 mins prior to appt time and to bring and updated insurance card. Pt is aware of appt location.   

## 2022-03-21 DIAGNOSIS — Z419 Encounter for procedure for purposes other than remedying health state, unspecified: Secondary | ICD-10-CM | POA: Diagnosis not present

## 2022-03-30 ENCOUNTER — Inpatient Hospital Stay: Payer: Medicaid Other | Attending: Hematology | Admitting: Hematology

## 2022-03-30 ENCOUNTER — Encounter: Payer: Self-pay | Admitting: Hematology

## 2022-03-30 ENCOUNTER — Other Ambulatory Visit: Payer: Self-pay

## 2022-03-30 ENCOUNTER — Telehealth: Payer: Self-pay

## 2022-03-30 VITALS — BP 104/74 | HR 83 | Temp 98.5°F | Resp 15 | Ht 63.5 in | Wt 129.2 lb

## 2022-03-30 DIAGNOSIS — F1010 Alcohol abuse, uncomplicated: Secondary | ICD-10-CM | POA: Insufficient documentation

## 2022-03-30 DIAGNOSIS — F319 Bipolar disorder, unspecified: Secondary | ICD-10-CM | POA: Insufficient documentation

## 2022-03-30 DIAGNOSIS — Z803 Family history of malignant neoplasm of breast: Secondary | ICD-10-CM | POA: Diagnosis not present

## 2022-03-30 DIAGNOSIS — R5383 Other fatigue: Secondary | ICD-10-CM | POA: Insufficient documentation

## 2022-03-30 DIAGNOSIS — N920 Excessive and frequent menstruation with regular cycle: Secondary | ICD-10-CM | POA: Diagnosis not present

## 2022-03-30 DIAGNOSIS — D500 Iron deficiency anemia secondary to blood loss (chronic): Secondary | ICD-10-CM | POA: Diagnosis not present

## 2022-03-30 DIAGNOSIS — R10811 Right upper quadrant abdominal tenderness: Secondary | ICD-10-CM | POA: Diagnosis not present

## 2022-03-30 DIAGNOSIS — Z975 Presence of (intrauterine) contraceptive device: Secondary | ICD-10-CM | POA: Insufficient documentation

## 2022-03-30 DIAGNOSIS — D519 Vitamin B12 deficiency anemia, unspecified: Secondary | ICD-10-CM

## 2022-03-30 DIAGNOSIS — R1011 Right upper quadrant pain: Secondary | ICD-10-CM

## 2022-03-30 NOTE — Progress Notes (Signed)
[facility]   Telephone:([phone] Fax:(336) [phone]   Clinic New consult Note   Patient Care Team: Elenore Paddy, NP as PCP - General (Nurse Practitioner) Chrystie Nose, MD as PCP - Cardiology (Cardiology) Malachy Mood, MD as Consulting Physician (Hematology) 03/30/2022  CHIEF COMPLAINTS/PURPOSE OF CONSULTATION:  Iron Deficiency Anemia  ASSESSMENT & PLAN:  Andrea [name] is a 31 y.o. pre-menopausal female with a history of   1. 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 Currently has copper IUD in place (placed ~5 months ago after birth of daughter) -symptomatic with fatigue -currently on prenatal vitamin, ferrous sulfate, and vit B12 supplements. -labs from 03/15/22 reviewed, ferritin remains very low at 4.1, iron improved to 80 with oral iron, vit B12 on low end of normal.  --I reviewed these results with her today. Given the persistently low ferritin and fatigue, I recommend IV iron. We will also obtain an abdomen US.  -I also discussed her vit B12 is WNL but on the low end of normal, despite oral supplements. I explained that sometimes B12 deficiency can be related to alcohol use. I recommend B12 injection.  2. Bipolar depression, Alcohol abuse, history of multisubstance abuse -previously did recreational drugs including heroin and cocaine, has been sober for 6 years. Switched to drinking alcohol after stopping drugs. -previously followed by a counselor while living in [place], has never been on medication. -she has been sober for 3 months but is afraid of going back. I offered referral to psychology. It looks like her PCP previously referred her to psychiatry, but this does not appear to have been completed. She is interested in referral, and I will send to Dr. Bosie Clos. -Due to her heavy alcohol drinking and right upper quadrant abdominal tenderness, I will order abdominal ultrasound for further  evaluation  3. 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   PLAN: -referral to genetics -referral to psychology Dr. Bosie Clos for alcohol cessation -abdomen US to be done soon -IV iron venofer 400mg  and B12 injection in 1 and 3 weeks Lab in 6 weeks Lab and f/u in 3 months    HISTORY OF PRESENTING ILLNESS:  Andrea [name] 31 y.o. female is here because of iron deficiency anemia.  She reports chronic anemia for the last ~6 years. She is symptomatic with fatigue, which she has attributed to being a mom with 4 kids. She has a history of heavy periods, with each cycle lasting 9-12 days. She notes she has a copper IUD in place. She is currently taking a prenatal vitamin and ferrous sulfate.   She has a PMH of... 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 -she is a single mom with 4 children. Her kids are aged 70, 6, 5, and 7 months. -she lives alone; her partner is currently incarcerated. She has support from both her and her partner's families. -previously drank 3 pints of alcohol a week for a number of years, not while pregnant. She has been sober for 3 months. -family history of anemia and endometriosis in numerous females in her family. -family history of breast cancer in her mother at age 62 and her paternal grandmother at age 18. -previously used recreational drugs, sober for last 6 years.   REVIEW OF SYSTEMS:  Constitutional: Denies fevers, chills or abnormal night sweats Eyes: Denies blurriness of vision, double vision or watery eyes Ears, nose, mouth, throat, and face: Denies mucositis or sore throat Respiratory: Denies cough, dyspnea or wheezes Cardiovascular: Denies palpitation, chest discomfort or lower extremity swelling Gastrointestinal:  Denies  nausea, heartburn or change in bowel habits Skin: Denies abnormal skin rashes Lymphatics: Denies new lymphadenopathy or easy bruising Neurological:Denies numbness, tingling or new weaknesses Behavioral/Psych: Mood is stable, no new changes   All other systems were reviewed with the patient and are negative.   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   RND (reflex neurovascular dystrophy)    TMJ (dislocation of temporomandibular joint)     SURGICAL HISTORY: Past Surgical History:  Procedure Laterality Date   TONSILLECTOMY AND ADENOIDECTOMY  2003    SOCIAL HISTORY: Social History   Socioeconomic History   Marital status: Significant Other    Spouse name: Not on file   Number of children: 4   Years of education: Not on file   Highest education level: Not on file  Occupational History   Occupation: [employer]   Tobacco Use   Smoking status: Never   Smokeless tobacco: Never  Vaping Use   Vaping Use: Never used  Substance and Sexual Activity   Alcohol use: Not Currently    Comment: used to drink liquor 3 pines a week   Drug use: Yes    Types: Cocaine, Heroin    Comment: stopped in 2017   Sexual activity: Yes    Birth control/protection: None    Comment: nexplanon  Other Topics Concern   Not on file  Social History Narrative   Lives with kids and 2018. Has been with Fayrene Fearing for 15 years, since 6th grade.    Social Determinants of Health   Financial Resource Strain: Not on file  Food Insecurity: No Food Insecurity (07/06/2021)   Hunger Vital Sign    Worried About Running Out of Food in the Last Year: Never true    Ran Out of Food in the Last Year: Never true  Transportation Needs: Not on file  Physical  Activity: Not on file  Stress: Not on file  Social Connections: Not on file  Intimate Partner Violence: Not on file    FAMILY HISTORY: Family History  Problem Relation Age of Onset   Breast cancer Mother 65   Hyperlipidemia Father    Alcoholism Father    Diabetes Sister    Post-traumatic stress disorder Sister    Colon cancer Maternal Grandmother    Breast cancer Paternal Grandmother        breast cancer   Diabetes Mellitus II Paternal Grandmother     ALLERGIES:  is allergic to cymbalta [duloxetine hcl], duloxetine, clindamycin/lincomycin, and ultrasound gel.  MEDICATIONS:  Current Outpatient Medications  Medication Sig Dispense Refill   ferrous sulfate 325 (65 FE) MG tablet Take 1 tablet (325 mg total) by mouth 2 (two) times daily with a meal. 30 tablet 3   gabapentin (NEURONTIN) 100 MG capsule Take 1 capsule (100 mg total) by mouth at bedtime. 30 capsule 2   hydrOXYzine (VISTARIL) 25 MG capsule Take 1-2 capsules (25-50 mg total) by mouth every 8 (eight) hours as needed.  30 capsule 2   paragard intrauterine copper IUD IUD 1 each by Intrauterine route once.     Prenat-FeCbn-FeAsp-Meth-FA-DHA (PRENATE MINI) 18-0.6-0.4-350 MG CAPS Take 1 capsule by mouth daily. 30 capsule 11   No current facility-administered medications for this visit.    PHYSICAL EXAMINATION: ECOG PERFORMANCE STATUS: 1 - Symptomatic but completely ambulatory  Vitals:   03/30/22 1140  BP: 104/74  Pulse: 83  Resp: 15  Temp: 98.5 F (36.9 C)  SpO2: 100%   Filed Weights   03/30/22 1140  Weight: 129 lb 3.2 oz (58.6 kg)    GENERAL:alert, no distress and comfortable SKIN: skin color, texture, turgor are normal, no rashes or significant lesions EYES: normal, conjunctiva are pink and non-injected, sclera clear OROPHARYNX:no exudate, no erythema and lips, buccal mucosa, and tongue normal  NECK: supple, thyroid normal size, non-tender, without nodularity LYMPH:  no palpable lymphadenopathy in the  cervical, axillary or inguinal LUNGS: clear to auscultation and percussion with normal breathing effort HEART: regular rate & rhythm and no murmurs and no lower extremity edema ABDOMEN:abdomen soft, non-tender and normal bowel sounds Musculoskeletal:no cyanosis of digits and no clubbing  PSYCH: alert & oriented x 3 with fluent speech NEURO: no focal motor/sensory deficits  LABORATORY DATA:  I have reviewed the data as listed    Latest Ref Rng & Units 12/14/2021    9:07 AM 11/09/2021    9:12 AM 08/16/2021    8:05 AM  CBC  WBC 4.0 - 10.5 K/uL 7.4  6.8  9.1   Hemoglobin 12.0 - 15.0 g/dL 29.0  21.1  9.7   Hematocrit 36.0 - 46.0 % 35.4  35.6  30.9   Platelets 150.0 - 400.0 K/uL 580.0  453.0  291        Latest Ref Rng & Units 11/09/2021    9:12 AM 08/16/2021    8:05 AM 04/30/2018   10:00 AM  CMP  Glucose 70 - 99 mg/dL 93  155  96   BUN 6 - 23 mg/dL 13  6  7    Creatinine 0.40 - 1.20 mg/dL  2.08  0.22   Sodium 135 - 145 mEq/L 138  136  139   Potassium 3.5 - 5.1 mEq/L 4.0  3.8  4.2   Chloride 96 - 112 mEq/L 105  105  103   CO2 19 - 32 mEq/L 29  23  24    Calcium 8.4 - 10.5 mg/dL 8.8  8.6  8.5   Total Protein 6.0 - 8.3 g/dL 6.5  5.6  5.9   Total Bilirubin 0.2 - 1.2 mg/dL 0.3  0.5  0.2   Alkaline Phos 39 - 117 U/L 44  79  55   AST 0 - 37 U/L 11  15  7    ALT 0 - 35 U/L 11  10  7       RADIOGRAPHIC STUDIES: I have personally reviewed the radiological images as listed and agreed with the findings in the report. No results found.  Orders Placed This Encounter  Procedures   3.36 Abdomen Complete    History of alcohol abuse, rule out liver disease    Standing Status:   Future    Standing Expiration Date:   03/30/2023    Order Specific Question:   Reason for Exam (SYMPTOM  OR DIAGNOSIS REQUIRED)    Answer:   RUQ abdominal tenderness    Order Specific Question:   Preferred imaging location?    Answer:   [facility]  Intrinsic factor antibodies    Standing Status:   Future     Standing Expiration Date:   03/30/2023   Ambulatory referral to Genetics    Referral Priority:   Routine    Referral Type:   Consultation    Referral Reason:   Specialty Services Required    Number of Visits Requested:   1   Ambulatory referral to Psychiatry    Referral Priority:   Routine    Referral Type:   Psychiatric    Referral Reason:   Specialty Services Required    Requested Specialty:   Psychiatry    Number of Visits Requested:   1    All questions were answered. The patient knows to call the clinic with any problems, questions or concerns. The total time spent in the appointment was 45 minutes.     Malachy Mood, MD 03/30/2022   I, Mickie Bail, am acting as scribe for Malachy Mood, MD.   I have reviewed the above documentation for accuracy and completeness, and I agree with the above.

## 2022-03-30 NOTE — Telephone Encounter (Signed)
Spoke with pt via telephone regarding her appt today.  Informed pt that Dr. Mosetta Putt would like for the pt to come in at 11:20 am today if possible.  Pt confirmed that she could come in today at 11:20 am 03/30/2022.

## 2022-04-02 ENCOUNTER — Telehealth: Payer: Self-pay | Admitting: Hematology

## 2022-04-02 NOTE — Telephone Encounter (Signed)
Spoke with patient confirming upcoming appointments  

## 2022-04-04 ENCOUNTER — Ambulatory Visit (HOSPITAL_COMMUNITY)
Admission: RE | Admit: 2022-04-04 | Discharge: 2022-04-04 | Disposition: A | Discharge status: Home / Self Care | Payer: Medicaid Other | Source: Ambulatory Visit | Attending: Hematology | Admitting: Hematology

## 2022-04-04 DIAGNOSIS — R1011 Right upper quadrant pain: Secondary | ICD-10-CM | POA: Insufficient documentation

## 2022-04-04 DIAGNOSIS — F331 Major depressive disorder, recurrent, moderate: Secondary | ICD-10-CM | POA: Diagnosis not present

## 2022-04-07 NOTE — Congregational Nurse Program (Signed)
New member at [facility]  Introduced myself and discussed my role. Has Medicaid.  She has infant and taking GED classes. Reports she is bottle feeding infant now.  Juliann Pulse, RN, CCNP, [phone]

## 2022-04-09 ENCOUNTER — Telehealth: Payer: Self-pay

## 2022-04-09 NOTE — Telephone Encounter (Signed)
Made patient aware of below results per Dr. Avanell Shackleton, MD sent to Arcelia Jew, RN Cc: Verlee Rossetti, CMA Please let pt know her [place] results, she probably has mild fatty liver, no other concerns, thanks  Malachy Mood

## 2022-04-11 ENCOUNTER — Inpatient Hospital Stay: Payer: Medicaid Other

## 2022-04-11 ENCOUNTER — Other Ambulatory Visit: Payer: Self-pay

## 2022-04-11 ENCOUNTER — Encounter: Payer: Self-pay | Admitting: Hematology

## 2022-04-11 VITALS — BP 102/74 | HR 73 | Temp 98.4°F | Resp 16

## 2022-04-11 DIAGNOSIS — E538 Deficiency of other specified B group vitamins: Secondary | ICD-10-CM

## 2022-04-11 DIAGNOSIS — F1010 Alcohol abuse, uncomplicated: Secondary | ICD-10-CM | POA: Diagnosis not present

## 2022-04-11 DIAGNOSIS — D500 Iron deficiency anemia secondary to blood loss (chronic): Secondary | ICD-10-CM | POA: Diagnosis not present

## 2022-04-11 DIAGNOSIS — F331 Major depressive disorder, recurrent, moderate: Secondary | ICD-10-CM | POA: Diagnosis not present

## 2022-04-11 DIAGNOSIS — R10811 Right upper quadrant abdominal tenderness: Secondary | ICD-10-CM | POA: Diagnosis not present

## 2022-04-11 DIAGNOSIS — Z975 Presence of (intrauterine) contraceptive device: Secondary | ICD-10-CM | POA: Diagnosis not present

## 2022-04-11 DIAGNOSIS — Z803 Family history of malignant neoplasm of breast: Secondary | ICD-10-CM | POA: Diagnosis not present

## 2022-04-11 DIAGNOSIS — R5383 Other fatigue: Secondary | ICD-10-CM | POA: Diagnosis not present

## 2022-04-11 DIAGNOSIS — N920 Excessive and frequent menstruation with regular cycle: Secondary | ICD-10-CM | POA: Diagnosis not present

## 2022-04-11 DIAGNOSIS — D509 Iron deficiency anemia, unspecified: Secondary | ICD-10-CM

## 2022-04-11 DIAGNOSIS — F319 Bipolar disorder, unspecified: Secondary | ICD-10-CM | POA: Diagnosis not present

## 2022-04-11 MED ORDER — CYANOCOBALAMIN 1000 MCG/ML IJ SOLN
1000.0000 ug | Freq: Once | INTRAMUSCULAR | Status: AC
  Administered 2022-04-11: 1000 ug via INTRAMUSCULAR
  Filled 2022-04-11: qty 1

## 2022-04-11 MED ORDER — SODIUM CHLORIDE 0.9 % IV SOLN
400.0000 mg | Freq: Once | INTRAVENOUS | Status: AC
  Administered 2022-04-11: 400 mg via INTRAVENOUS
  Filled 2022-04-11: qty 20

## 2022-04-11 MED ORDER — SODIUM CHLORIDE 0.9 % IV SOLN: Freq: Once | INTRAVENOUS | Status: AC

## 2022-04-11 NOTE — Patient Instructions (Signed)

## 2022-04-16 ENCOUNTER — Encounter: Payer: Self-pay | Admitting: Hematology

## 2022-04-16 NOTE — Telephone Encounter (Signed)
Opened by mistake.

## 2022-04-20 DIAGNOSIS — H47323 Drusen of optic disc, bilateral: Secondary | ICD-10-CM | POA: Diagnosis not present

## 2022-04-20 DIAGNOSIS — Z419 Encounter for procedure for purposes other than remedying health state, unspecified: Secondary | ICD-10-CM | POA: Diagnosis not present

## 2022-04-20 DIAGNOSIS — H0261 Xanthelasma of right upper eyelid: Secondary | ICD-10-CM | POA: Diagnosis not present

## 2022-04-20 DIAGNOSIS — H0264 Xanthelasma of left upper eyelid: Secondary | ICD-10-CM | POA: Diagnosis not present

## 2022-04-23 ENCOUNTER — Inpatient Hospital Stay: Payer: Medicaid Other | Attending: Hematology

## 2022-04-23 ENCOUNTER — Other Ambulatory Visit: Payer: Self-pay

## 2022-04-23 VITALS — BP 108/79 | HR 72 | Temp 98.8°F | Resp 17

## 2022-04-23 DIAGNOSIS — D500 Iron deficiency anemia secondary to blood loss (chronic): Secondary | ICD-10-CM | POA: Insufficient documentation

## 2022-04-23 DIAGNOSIS — D509 Iron deficiency anemia, unspecified: Secondary | ICD-10-CM

## 2022-04-23 DIAGNOSIS — R10811 Right upper quadrant abdominal tenderness: Secondary | ICD-10-CM | POA: Insufficient documentation

## 2022-04-23 DIAGNOSIS — Z803 Family history of malignant neoplasm of breast: Secondary | ICD-10-CM | POA: Diagnosis not present

## 2022-04-23 DIAGNOSIS — F319 Bipolar disorder, unspecified: Secondary | ICD-10-CM | POA: Diagnosis not present

## 2022-04-23 DIAGNOSIS — E538 Deficiency of other specified B group vitamins: Secondary | ICD-10-CM

## 2022-04-23 DIAGNOSIS — N920 Excessive and frequent menstruation with regular cycle: Secondary | ICD-10-CM | POA: Insufficient documentation

## 2022-04-23 DIAGNOSIS — Z975 Presence of (intrauterine) contraceptive device: Secondary | ICD-10-CM | POA: Diagnosis not present

## 2022-04-23 DIAGNOSIS — R5383 Other fatigue: Secondary | ICD-10-CM | POA: Diagnosis not present

## 2022-04-23 DIAGNOSIS — F1010 Alcohol abuse, uncomplicated: Secondary | ICD-10-CM | POA: Insufficient documentation

## 2022-04-23 MED ORDER — CYANOCOBALAMIN 1000 MCG/ML IJ SOLN
1000.0000 ug | Freq: Once | INTRAMUSCULAR | Status: AC
  Administered 2022-04-23: 1000 ug via INTRAMUSCULAR
  Filled 2022-04-23: qty 1

## 2022-04-23 MED ORDER — SODIUM CHLORIDE 0.9 % IV SOLN: Freq: Once | INTRAVENOUS | Status: AC

## 2022-04-23 MED ORDER — SODIUM CHLORIDE 0.9 % IV SOLN
400.0000 mg | Freq: Once | INTRAVENOUS | Status: AC
  Administered 2022-04-23: 400 mg via INTRAVENOUS
  Filled 2022-04-23: qty 20

## 2022-04-23 NOTE — Patient Instructions (Signed)

## 2022-05-15 ENCOUNTER — Other Ambulatory Visit: Payer: Self-pay

## 2022-05-15 DIAGNOSIS — E538 Deficiency of other specified B group vitamins: Secondary | ICD-10-CM

## 2022-05-15 DIAGNOSIS — D509 Iron deficiency anemia, unspecified: Secondary | ICD-10-CM

## 2022-05-15 DIAGNOSIS — D519 Vitamin B12 deficiency anemia, unspecified: Secondary | ICD-10-CM

## 2022-05-16 ENCOUNTER — Inpatient Hospital Stay: Payer: Medicaid Other

## 2022-05-21 DIAGNOSIS — Z419 Encounter for procedure for purposes other than remedying health state, unspecified: Secondary | ICD-10-CM | POA: Diagnosis not present

## 2022-06-05 ENCOUNTER — Inpatient Hospital Stay: Payer: Medicaid Other | Attending: Hematology | Admitting: Genetic Counselor

## 2022-06-05 ENCOUNTER — Inpatient Hospital Stay: Payer: Medicaid Other

## 2022-06-21 DIAGNOSIS — Z419 Encounter for procedure for purposes other than remedying health state, unspecified: Secondary | ICD-10-CM | POA: Diagnosis not present

## 2022-07-04 ENCOUNTER — Inpatient Hospital Stay: Payer: Medicaid Other | Admitting: Hematology

## 2022-07-04 ENCOUNTER — Inpatient Hospital Stay: Payer: Medicaid Other | Attending: Hematology

## 2022-07-04 NOTE — Assessment & Plan Note (Deleted)
-  h/o heavy and long-lasting periods. Currently has copper IUD in place (placed ~5 months ago after birth of daughter) -symptomatic with fatigue -currently on prenatal vitamin, ferrous sulfate, and vit B12 supplements. -labs from 03/15/22 reviewed, ferritin remains very low at 4.1, iron improved to 80 with oral iron, vit B12 on low end of normal.  -she received iv venofer in Nov and Dec 2023

## 2022-07-04 NOTE — Progress Notes (Deleted)
[facility]   Telephone:([phone] Fax:(336) [phone]   Clinic Follow up Note   Patient Care Team: Ailene Ards, NP as PCP - General (Nurse Practitioner) Pixie Casino, MD as PCP - Cardiology (Cardiology) Truitt Merle, MD as Consulting Physician (Hematology)  Date of Service:  07/04/2022  CHIEF COMPLAINT: f/u of Iron Deficiency Anemia   CURRENT THERAPY:  IV iron venofer 461m and B12 injection Monthly  ASSESSMENT: *** Andrea [name] a 32y.o. female with   No problem-specific Assessment & Plan notes found for this encounter.  ***   PLAN:   SUMMARY OF ONCOLOGIC HISTORY: Oncology History   No history exists.     INTERVAL HISTORY: *** Andrea Avitabileis here for a follow up of Iron Deficiency Anemia  She was last seen by me on 03/30/2022 She presents to the clinic     All other systems were reviewed with the patient and are negative.  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  ALLERGIES:  is allergic to cymbalta [duloxetine hcl], duloxetine, clindamycin/lincomycin, and ultrasound gel.  MEDICATIONS:  Current Outpatient Medications  Medication Sig Dispense Refill   ferrous sulfate 325 (65 FE) MG tablet Take 1 tablet (325 mg total) by mouth 2 (two) times daily with a meal. 30 tablet 3   gabapentin (NEURONTIN) 100 MG capsule Take 1 capsule (100 mg total) by mouth at bedtime.  30 capsule 2   hydrOXYzine (VISTARIL) 25 MG capsule Take 1-2 capsules (25-50 mg total) by mouth every 8 (eight) hours as needed. 30 capsule 2   paragard intrauterine copper IUD IUD 1 each by Intrauterine route once.     Prenat-FeCbn-FeAsp-Meth-FA-DHA (PRENATE MINI) 18-0.6-0.4-350 MG CAPS Take 1 capsule by mouth daily. 30 capsule 11   No current facility-administered medications for this visit.    PHYSICAL EXAMINATION: ECOG PERFORMANCE STATUS: {CHL ONC ECOG [phone]}  There were no vitals filed for this visit. Wt Readings from Last 3 Encounters:  03/30/22 129 lb 3.2 oz (58.6 kg)  03/15/22 130 lb 4 oz (59.1 kg)  03/05/22 134 lb (60.8 kg)    {Only keep what was examined. If exam not performed, can use .CEXAM } GENERAL:alert, no distress and comfortable SKIN: skin color, texture, turgor are normal, no rashes or significant lesions EYES: normal, Conjunctiva are pink and non-injected, sclera clear {OROPHARYNX:no exudate, no erythema and lips, buccal mucosa, and tongue normal}  NECK: supple, thyroid normal size, non-tender, without nodularity LYMPH:  no palpable lymphadenopathy in the cervical, axillary {or inguinal} LUNGS: clear to auscultation and percussion with normal breathing effort HEART: regular rate & rhythm and no murmurs and no lower extremity edema ABDOMEN:abdomen soft, non-tender and normal bowel sounds Musculoskeletal:no cyanosis of digits and no clubbing  NEURO: alert & oriented x 3 with fluent speech, no focal motor/sensory deficits  LABORATORY DATA:  I have reviewed the data as listed  Latest Ref Rng & Units 12/14/2021    9:07 AM 11/09/2021    9:12 AM 08/16/2021    8:05 AM  CBC  WBC 4.0 - 10.5 K/uL 7.4  6.8  9.1   Hemoglobin 12.0 - 15.0 g/dL 11.3  11.3  9.7   Hematocrit 36.0 - 46.0 % 35.4  35.6  30.9   Platelets 150.0 - 400.0 K/uL 580.0  453.0  291         Latest Ref Rng & Units 11/09/2021    9:12 AM 08/16/2021    8:05 AM 04/30/2018   10:00 AM  CMP   Glucose 70 - 99 mg/dL 93  121  96   BUN 6 - 23 mg/dL 13  6  7   $ Creatinine 0.40 - 1.20 mg/dL 0.74  0.54  0.62   Sodium 135 - 145 mEq/L 138  136  139   Potassium 3.5 - 5.1 mEq/L 4.0  3.8  4.2   Chloride 96 - 112 mEq/L 105  105  103   CO2 19 - 32 mEq/L 29  23  24   $ Calcium 8.4 - 10.5 mg/dL 8.8  8.6  8.5   Total Protein 6.0 - 8.3 g/dL 6.5  5.6  5.9   Total Bilirubin 0.2 - 1.2 mg/dL 0.3  0.5  0.2   Alkaline Phos 39 - 117 U/L 44  79  55   AST 0 - 37 U/L 11  15  7   $ ALT 0 - 35 U/L 11  10  7       $ RADIOGRAPHIC STUDIES: I have personally reviewed the radiological images as listed and agreed with the findings in the report. No results found.    No orders of the defined types were placed in this encounter.  All questions were answered. The patient knows to call the clinic with any problems, questions or concerns. No barriers to learning was detected. The total time spent in the appointment was {CHL ONC TIME VISIT - [id number].     Baldemar Friday, CMA 07/04/2022   I, Audry Riles, CMA, am acting as scribe for Truitt Merle, MD.   {Add scribe attestation statement}

## 2022-07-20 DIAGNOSIS — Z419 Encounter for procedure for purposes other than remedying health state, unspecified: Secondary | ICD-10-CM | POA: Diagnosis not present

## 2022-08-20 DIAGNOSIS — Z419 Encounter for procedure for purposes other than remedying health state, unspecified: Secondary | ICD-10-CM | POA: Diagnosis not present

## 2022-09-12 ENCOUNTER — Telehealth: Payer: Self-pay

## 2022-09-12 NOTE — Telephone Encounter (Signed)
LVM for patient to call back to schedule apt. AS CMA

## 2022-09-14 ENCOUNTER — Ambulatory Visit: Payer: Medicaid Other | Admitting: Nurse Practitioner

## 2022-09-19 DIAGNOSIS — Z419 Encounter for procedure for purposes other than remedying health state, unspecified: Secondary | ICD-10-CM | POA: Diagnosis not present

## 2022-10-20 DIAGNOSIS — Z419 Encounter for procedure for purposes other than remedying health state, unspecified: Secondary | ICD-10-CM | POA: Diagnosis not present

## 2022-11-19 DIAGNOSIS — Z419 Encounter for procedure for purposes other than remedying health state, unspecified: Secondary | ICD-10-CM | POA: Diagnosis not present

## 2022-12-20 DIAGNOSIS — Z419 Encounter for procedure for purposes other than remedying health state, unspecified: Secondary | ICD-10-CM | POA: Diagnosis not present

## 2023-01-20 DIAGNOSIS — Z419 Encounter for procedure for purposes other than remedying health state, unspecified: Secondary | ICD-10-CM | POA: Diagnosis not present

## 2023-02-19 DIAGNOSIS — Z419 Encounter for procedure for purposes other than remedying health state, unspecified: Secondary | ICD-10-CM | POA: Diagnosis not present

## 2023-03-22 DIAGNOSIS — Z419 Encounter for procedure for purposes other than remedying health state, unspecified: Secondary | ICD-10-CM | POA: Diagnosis not present

## 2023-04-21 DIAGNOSIS — Z419 Encounter for procedure for purposes other than remedying health state, unspecified: Secondary | ICD-10-CM | POA: Diagnosis not present

## 2023-05-22 DIAGNOSIS — Z419 Encounter for procedure for purposes other than remedying health state, unspecified: Secondary | ICD-10-CM | POA: Diagnosis not present

## 2023-06-22 DIAGNOSIS — Z419 Encounter for procedure for purposes other than remedying health state, unspecified: Secondary | ICD-10-CM | POA: Diagnosis not present

## 2023-07-20 DIAGNOSIS — Z419 Encounter for procedure for purposes other than remedying health state, unspecified: Secondary | ICD-10-CM | POA: Diagnosis not present

## 2023-08-31 DIAGNOSIS — Z419 Encounter for procedure for purposes other than remedying health state, unspecified: Secondary | ICD-10-CM | POA: Diagnosis not present

## 2023-09-30 DIAGNOSIS — Z419 Encounter for procedure for purposes other than remedying health state, unspecified: Secondary | ICD-10-CM | POA: Diagnosis not present

## 2023-10-31 DIAGNOSIS — Z419 Encounter for procedure for purposes other than remedying health state, unspecified: Secondary | ICD-10-CM | POA: Diagnosis not present

## 2023-11-30 DIAGNOSIS — Z419 Encounter for procedure for purposes other than remedying health state, unspecified: Secondary | ICD-10-CM | POA: Diagnosis not present

## 2023-12-31 DIAGNOSIS — Z419 Encounter for procedure for purposes other than remedying health state, unspecified: Secondary | ICD-10-CM | POA: Diagnosis not present

## 2024-01-31 DIAGNOSIS — Z419 Encounter for procedure for purposes other than remedying health state, unspecified: Secondary | ICD-10-CM | POA: Diagnosis not present

## 2024-02-02 IMAGING — US US MFM FETAL BPP W/O NON-STRESS
1 series · 13 of 28 positions shown · non-contrast
Comparison: none

[Series 1: us mfm fetal bpp w/o non-stress · 43 acquisitions, 13 frames shown]
[im 2/43]
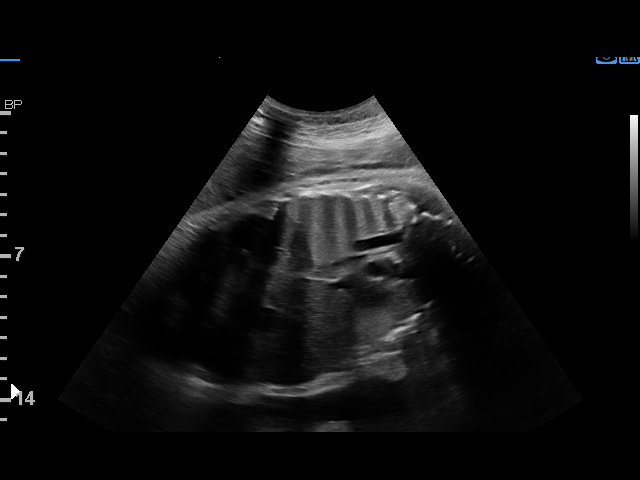
[im 5/43]
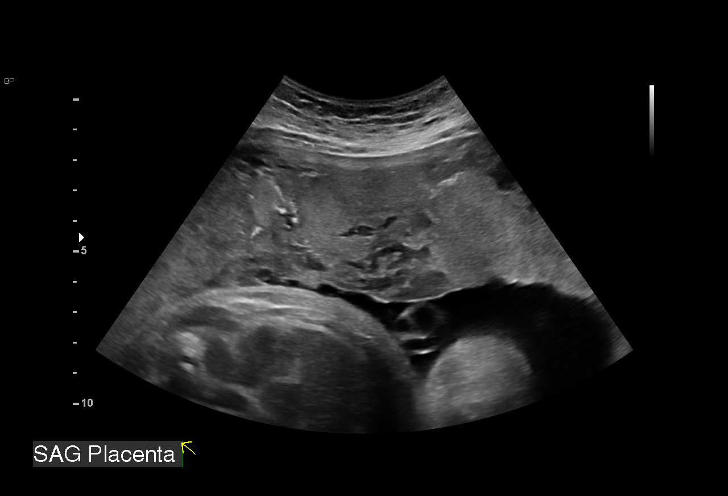
[im 8/43]
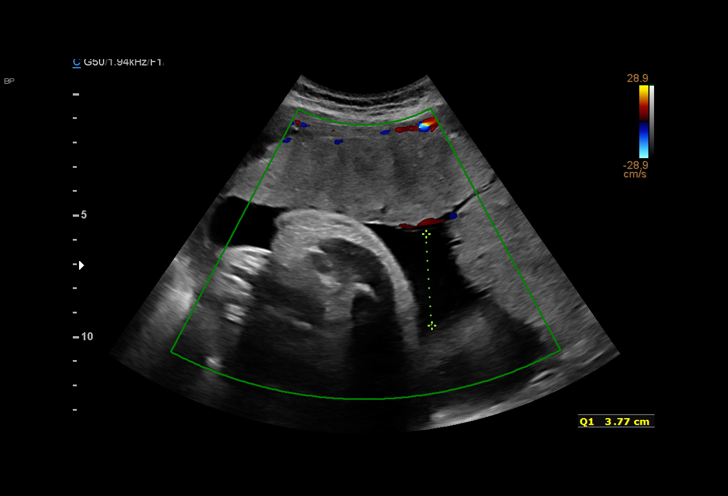
[im 11/43]
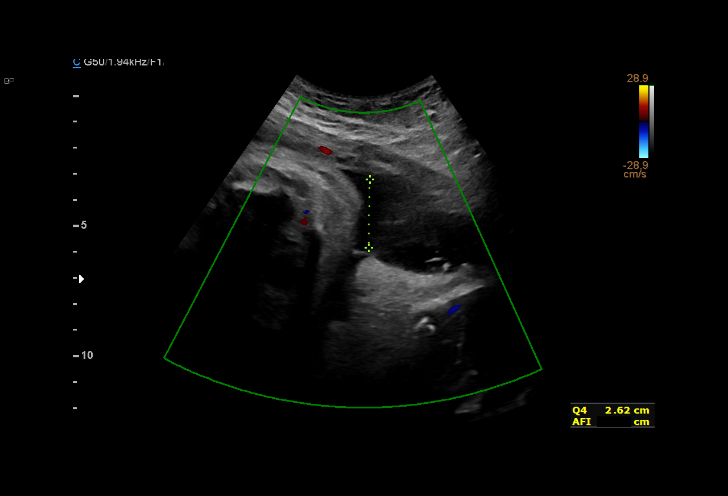
[im 15/43]
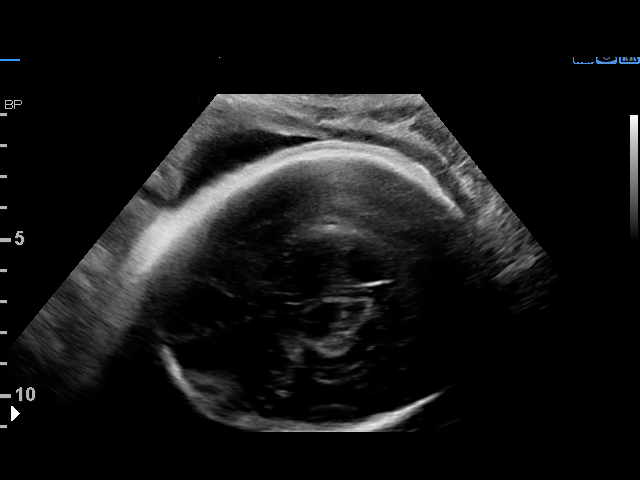
[im 18/43]
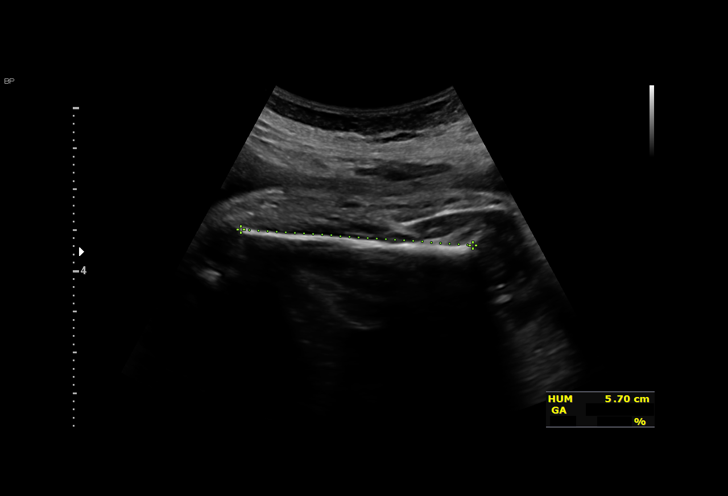
[im 22/43]
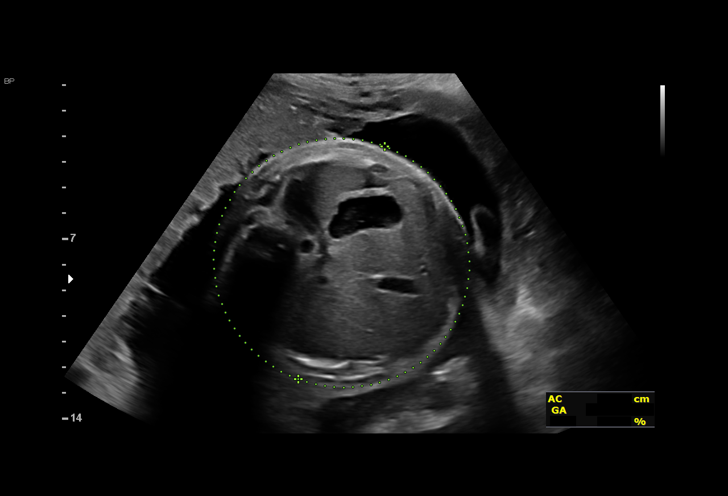
[im 25/43]
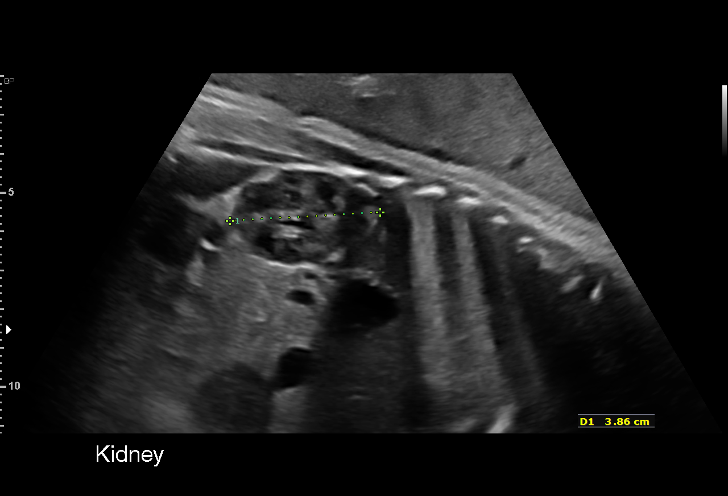
[im 29/43]
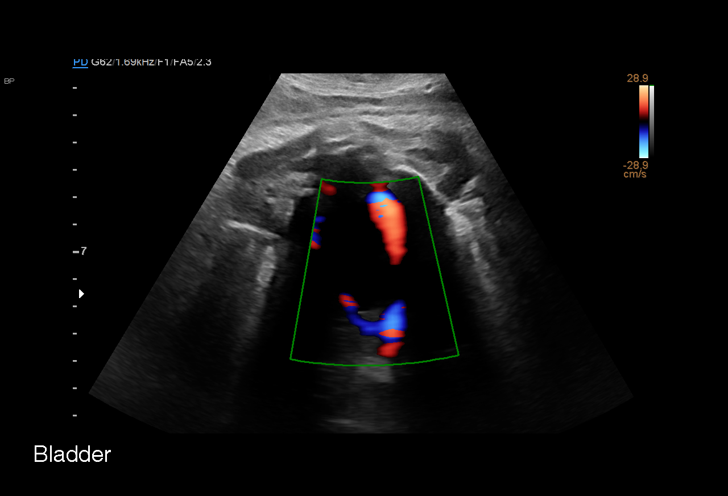
[im 32/43]
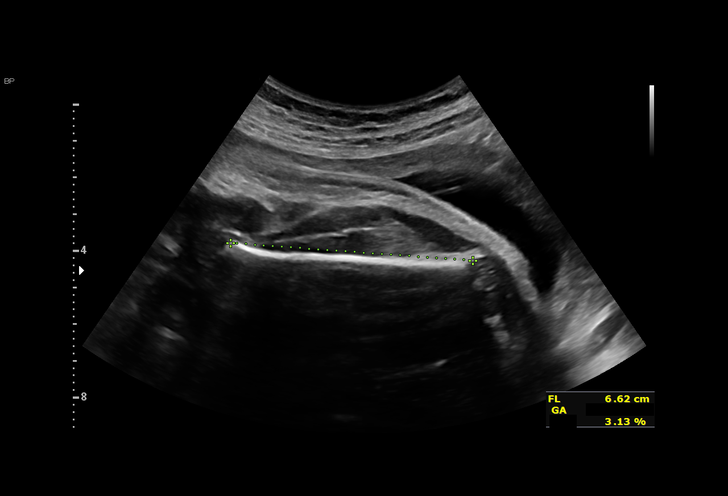
[im 35/43]
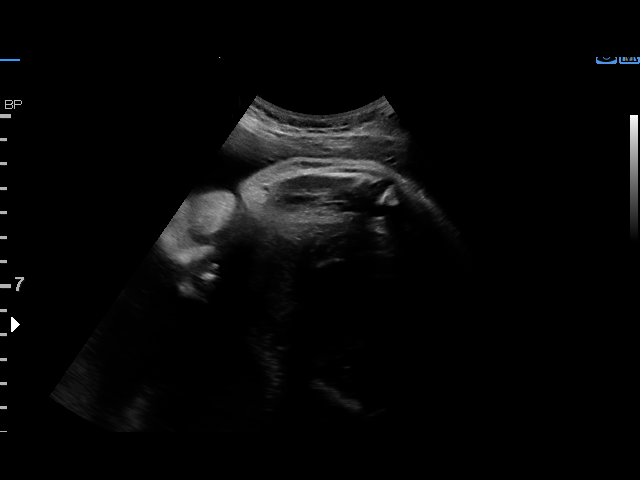
[im 38/43]
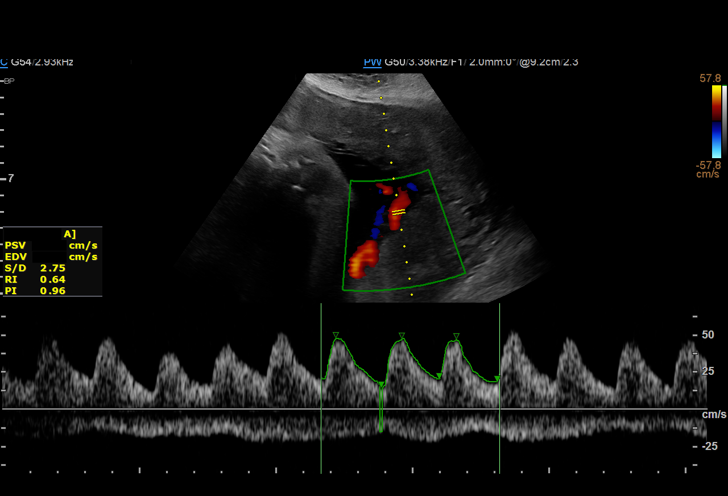
[im 41/43]
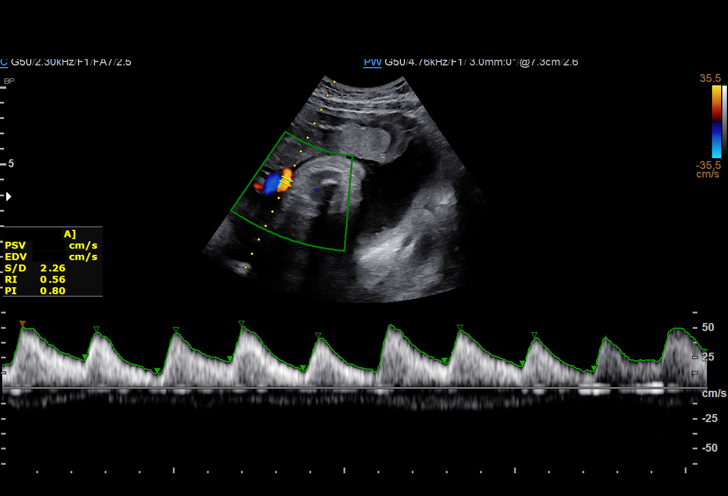

[13 of 28 positions shown; findings below may reference images not displayed]

Indications

 Maternal care for known or suspected poor
 fetal growth, third trimester, not applicable or
 unspecified IUGR
 Gestational diabetes in pregnancy,
 controlled by oral hypoglycemic drugs 4
 Traumatic injury during pregnancy (fell)
 Prior poor obstetrical history antepartum,
 third trimester
 Poor obstetric history: Previous preterm
 delivery, antepartum (x3) (17p)
 Previous pregnancy with congenital anomaly
 (gastroschisis)
 36 weeks gestation of pregnancy
Fetal Evaluation

 Num Of Fetuses:         1
 Fetal Heart Rate(bpm):  141
 Cardiac Activity:       Observed
 Presentation:           Cephalic
 Placenta:               Anterior
 P. Cord Insertion:      Previously Visualized
 Amniotic Fluid
 AFI FV:      Within normal limits

 AFI Sum(cm)     %Tile       Largest Pocket(cm)
 15.77           59

 RUQ(cm)       RLQ(cm)       LUQ(cm)        LLQ(cm)

Biophysical Evaluation

 Amniotic F.V:   Pocket => 2 cm             F. Tone:        Observed
 F. Movement:    Observed                   Score:          [DATE]
 F. Breathing:   Observed
Biometry

 BPD:      90.5  mm     G. Age:  36w 5d         63  %    CI:        78.62   %    70 - 86
                                                         FL/HC:      20.5   %    20.8 -
 HC:      322.8  mm     G. Age:  36w 3d         18  %    HC/AC:      1.05        0.92 -
 AC:      308.2  mm     G. Age:  34w 5d         13  %    FL/BPD:     73.0   %    71 - 87
 FL:       66.1  mm     G. Age:  34w 0d        2.8  %    FL/AC:      21.4   %    20 - 24
 HUM:      56.7  mm     G. Age:  33w 0d        < 5  %
 Est. FW:    2552  gm    5 lb 10 oz      14  %
OB History

 Gravidity:    6         Term:   0        Prem:   3        SAB:   0
 TOP:          2       Ectopic:  0        Living: 3
Gestational Age

 LMP:           36w 5d        Date:  11/30/20                 EDD:   09/06/21
 U/S Today:     35w 3d                                        EDD:   09/15/21
 Best:          36w 5d     Det. By:  LMP  (11/30/20)          EDD:   09/06/21
Anatomy

 Cranium:               Appears normal         Aortic Arch:            Previously seen
 Cavum:                 Appears normal         Ductal Arch:            Previously seen
 Ventricles:            Previously seen        Diaphragm:              Previously seen
 Choroid Plexus:        Previously seen        Stomach:                Appears normal, left
                                                                       sided
 Cerebellum:            Previously seen        Abdomen:                Previously seen
 Posterior Fossa:       Previously seen        Abdominal Wall:         Previously seen
 Nuchal Fold:           Previously seen        Cord Vessels:           Previously seen
 Face:                  Orbits and profile     Kidneys:                Appear normal
                        previously seen
 Lips:                  Previously seen        Bladder:                Appears normal
 Thoracic:              Previously seen        Spine:                  Previously seen
 Heart:                 Previously seen        Upper Extremities:      Previously seen
 RVOT:                  Previously seen        Lower Extremities:      Previously seen
 LVOT:                  Previously seen

 Other:  VC, 3VV and 3VTV previously visualized.Female gender previously
         seen. Heels/feet and open hands/5th digits, nasal bone, lenses,
         maxilla and mandible previously visualized.
Doppler - Fetal Vessels

 Umbilical Artery
  S/D     %tile      RI    %tile      PI    %tile     PSV    ADFV    RDFV
                                                    (cm/s)
  2.43       55    0.59       61    0.[REDACTED]      No      No

Cervix Uterus Adnexa

 Cervix
 Not visualized (advanced GA >65wks)

 Uterus
 Normal shape and size.

 Right Ovary
 Not visualized.

 Left Ovary
 Not visualized.
Comments

 This patient was seen due to an IUGR fetus and gestational
 diabetes treated with metformin.  She denies any problems
 since her last exam.  She reports feeling vigorous fetal
 movements throughout the day.
 The overall EFW obtained today (5 pounds 10 ounces)
 measures at the 14th percentile for her gestational age
 indicating borderline IUGR.  There is normal amniotic fluid
 noted.
 A biophysical profile performed today was [DATE].
 Doppler studies of the umbilical arteries performed due to
 fetal growth restriction showed a normal S/D ratio of 2.43.
 There were no signs of absent or reversed end-diastolic flow
 noted today.
 Due to borderline IUGR and gestational diabetes, she is
 already scheduled for delivery in 2 days.

 No further exams were scheduled in our office.

## 2024-04-01 DIAGNOSIS — Z419 Encounter for procedure for purposes other than remedying health state, unspecified: Secondary | ICD-10-CM | POA: Diagnosis not present

## 2024-05-01 DIAGNOSIS — Z419 Encounter for procedure for purposes other than remedying health state, unspecified: Secondary | ICD-10-CM | POA: Diagnosis not present
